# Patient Record
Sex: Female | Born: 1985 | Race: White | Hispanic: No | Marital: Married | Smoking: Never smoker
Health system: Southern US, Community
[De-identification: ages and names within clinical notes are randomized; demographics above are authoritative.]

## PROBLEM LIST (undated history)

## (undated) DIAGNOSIS — T7840XA Allergy, unspecified, initial encounter: Secondary | ICD-10-CM

## (undated) DIAGNOSIS — Z889 Allergy status to unspecified drugs, medicaments and biological substances status: Secondary | ICD-10-CM

## (undated) DIAGNOSIS — Z8719 Personal history of other diseases of the digestive system: Secondary | ICD-10-CM

## (undated) DIAGNOSIS — E079 Disorder of thyroid, unspecified: Secondary | ICD-10-CM

## (undated) DIAGNOSIS — F419 Anxiety disorder, unspecified: Secondary | ICD-10-CM

## (undated) HISTORY — DX: Allergy, unspecified, initial encounter: T78.40XA

## (undated) HISTORY — DX: Disorder of thyroid, unspecified: E07.9

## (undated) HISTORY — DX: Personal history of other diseases of the digestive system: Z87.19

## (undated) HISTORY — DX: Anxiety disorder, unspecified: F41.9

## (undated) HISTORY — DX: Allergy status to unspecified drugs, medicaments and biological substances: Z88.9

---
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
## 2008-08-25 ENCOUNTER — Emergency Department: Payer: Self-pay | Admitting: Emergency Medicine

## 2010-09-14 ENCOUNTER — Emergency Department (HOSPITAL_COMMUNITY): Admission: EM | Admit: 2010-09-14 | Discharge: 2010-09-14 | Payer: Self-pay | Admitting: Family Medicine

## 2011-02-17 LAB — GC/CHLAMYDIA PROBE AMP, GENITAL
Chlamydia, DNA Probe: NEGATIVE
GC Probe Amp, Genital: NEGATIVE

## 2011-02-17 LAB — POCT URINALYSIS DIPSTICK
Bilirubin Urine: NEGATIVE
Ketones, ur: NEGATIVE mg/dL
pH: 8 (ref 5.0–8.0)

## 2011-02-17 LAB — WET PREP, GENITAL: Yeast Wet Prep HPF POC: NONE SEEN

## 2011-02-17 LAB — POCT PREGNANCY, URINE: Preg Test, Ur: NEGATIVE

## 2013-08-21 ENCOUNTER — Telehealth: Payer: Self-pay | Admitting: Family Medicine

## 2013-08-21 NOTE — Telephone Encounter (Signed)
If you can - put her in for an acute appt - will still establish formally in oct

## 2013-08-21 NOTE — Telephone Encounter (Signed)
Pt is trying to est here and would like for you to be her PCP.  Your first new patient apptmt is 09/17/2013, however, the pt says she's experiencing some uncomfortable vaginal burning and would like to be seen sooner if possible. Can you accommodate her a new pt apptmt prior to 10/14? Thank you.

## 2013-08-22 NOTE — Telephone Encounter (Signed)
Left mssg for pt to return call.

## 2013-08-23 ENCOUNTER — Ambulatory Visit (INDEPENDENT_AMBULATORY_CARE_PROVIDER_SITE_OTHER): Payer: 59 | Admitting: Family Medicine

## 2013-08-23 ENCOUNTER — Encounter: Payer: Self-pay | Admitting: Family Medicine

## 2013-08-23 VITALS — BP 136/84 | HR 101 | Temp 99.0°F | Ht 68.5 in | Wt 194.0 lb

## 2013-08-23 DIAGNOSIS — N949 Unspecified condition associated with female genital organs and menstrual cycle: Secondary | ICD-10-CM

## 2013-08-23 DIAGNOSIS — R102 Pelvic and perineal pain: Secondary | ICD-10-CM

## 2013-08-23 DIAGNOSIS — N760 Acute vaginitis: Secondary | ICD-10-CM | POA: Insufficient documentation

## 2013-08-23 LAB — POCT WET PREP (WET MOUNT)

## 2013-08-23 LAB — POCT URINALYSIS DIPSTICK
Bilirubin, UA: NEGATIVE
Blood, UA: NEGATIVE
Ketones, UA: NEGATIVE
pH, UA: 7.5

## 2013-08-23 MED ORDER — METRONIDAZOLE 0.75 % VA GEL: 1.0000 | Freq: Every day | VAGINAL | Status: DC

## 2013-08-23 NOTE — Progress Notes (Signed)
  Subjective:    Patient ID: Tracey Garcia, female    DOB: 1986-01-26, 26 y.o.   MRN: [id number]  HPI Here for acute issue- vaginal discomfort She does not have an appt to establish yet-will do that in Oct   3 weeks of vaginal burning (not itching)  A little bit of discharge -on and off - no odor , little yellow tint to it  No cramping that she knows of   Does not get frequent vaginitis   No known exp to STDs- is monogamous Not on birth control  LMP was early sept  Using condoms   Clear ua today   Review of Systems Review of Systems  Constitutional: Negative for fever, appetite change, fatigue and unexpected weight change.  Eyes: Negative for pain and visual disturbance.  Respiratory: Negative for cough and shortness of breath.   Cardiovascular: Negative for cp or palpitations    Gastrointestinal: Negative for nausea, diarrhea and constipation.  Genitourinary: Negative for urgency and frequency.neg for dysuria   Skin: Negative for pallor or rash   Neurological: Negative for weakness, light-headedness, numbness and headaches.  Hematological: Negative for adenopathy. Does not bruise/bleed easily.  Psychiatric/Behavioral: Negative for dysphoric mood. The patient is not nervous/anxious.         Objective:   Physical Exam  Constitutional: She appears well-developed and well-nourished. No distress.  HENT:  Head: Normocephalic and atraumatic.  Eyes: Conjunctivae and EOM are normal. Pupils are equal, round, and reactive to light.  Cardiovascular: Normal rate and regular rhythm.   Abdominal: Soft. Bowel sounds are normal. She exhibits no distension. There is no tenderness.  Genitourinary:  Mild hyperemia of labia minora without lesions No discharge  Wet prep pos for clue cells  Neurological: She is alert. She has normal reflexes.  Skin: Skin is warm and dry. No rash noted.  Psychiatric: She has a normal mood and affect.          Assessment & Plan:

## 2013-08-23 NOTE — Patient Instructions (Addendum)
Use the metrogel vaginal - as directed Update if not starting to improve in a week or if worsening

## 2013-08-25 NOTE — Assessment & Plan Note (Signed)
Appears to be BV - clue cells seen on her wet prep  tx with metrogel vaginal and update  Disc symptomatic care - see instructions on AVS Will est for care in oct

## 2013-08-26 NOTE — Telephone Encounter (Signed)
Acute apptmt sch for 08/23/2013 and new pt apptmt sch 09/17/2013

## 2013-09-17 ENCOUNTER — Ambulatory Visit: Payer: Self-pay | Admitting: Family Medicine

## 2013-11-04 ENCOUNTER — Encounter: Payer: Self-pay | Admitting: Family Medicine

## 2013-11-04 ENCOUNTER — Ambulatory Visit (INDEPENDENT_AMBULATORY_CARE_PROVIDER_SITE_OTHER): Payer: 59 | Admitting: Family Medicine

## 2013-11-04 VITALS — BP 134/78 | HR 81 | Temp 98.2°F | Ht 68.0 in | Wt 196.0 lb

## 2013-11-04 DIAGNOSIS — Z309 Encounter for contraceptive management, unspecified: Secondary | ICD-10-CM

## 2013-11-04 DIAGNOSIS — K589 Irritable bowel syndrome without diarrhea: Secondary | ICD-10-CM

## 2013-11-04 DIAGNOSIS — F411 Generalized anxiety disorder: Secondary | ICD-10-CM

## 2013-11-04 NOTE — Patient Instructions (Signed)
For IBS I recommend getting citrucel - powder - to mix with water one serving per day  It may help your symptoms  We will do a gyn referral at check out  I recommend for the future - a flu vaccine and also you are due for a tetanus vaccine (Tdap) as well  Take care of yourself  Please send for last note/ pap report and labs from [facility] in [place]

## 2013-11-04 NOTE — Assessment & Plan Note (Signed)
Pt has been generally intolerant of oral contraceptives in the past  Interested in disc other options -like the IUD  Will ref to gyn for this and also annual exam

## 2013-11-04 NOTE — Assessment & Plan Note (Signed)
Disc this in detail  Pt has had it lifelong and developed coping skills Declines counseling (has done marriage counseling in the past) , and declines medication at this time - but will update [place] if she changes her mind Anxiety about shots did cause her to decline flu and Tdap vaccines - disc risks of them today as well

## 2013-11-04 NOTE — Assessment & Plan Note (Signed)
Suggested trial of citrucel to try and normalize bowel habits

## 2013-11-04 NOTE — Progress Notes (Signed)
Pre-visit discussion using our clinic review tool. No additional management support is needed unless otherwise documented below in the visit note.  

## 2013-11-04 NOTE — Progress Notes (Signed)
Subjective:    Patient ID: Tracey Garcia, female    DOB: Jan 04, 1986, 27 y.o.   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 Unsure if she wants to update this  She does not get flu shots - afraid of them - will think about it   Pap Jan 2014 in [place]  She is not on birth control  Not trying to get pregnant  She took OC in the past and she had a lot of emotional side effects -- she tried more than one  Most recently loestrin, , and 2 others  She is afraid of yasmin/ yaz   She has not had labs in a while   Patient Active Problem List   Diagnosis Date Noted  . Vaginitis 08/23/2013   Past Medical History  Diagnosis Date  . Anxiety   . Anxiety   . History of seasonal allergies   . History of IBS    Past Surgical History  Procedure Laterality Date  . Umbilical hernia repair Bilateral 01/2005   History  Substance Use Topics  . Smoking status: Never Smoker   . Smokeless tobacco: Never Used  . Alcohol Use: Yes     Comment: socially   Family History  Problem Relation Age of Onset  . Cancer Mother   . Celiac disease Mother   . Heart attack Paternal Grandfather   . Arthritis Maternal Grandmother   . Colon cancer      great grandmother  . Lung cancer Mother   . High Cholesterol Father   . Heart disease Maternal Grandfather   . High blood pressure Maternal  Grandmother   . High blood pressure Paternal Grandmother   . Diabetes Maternal Grandfather   . Sjogren's syndrome Maternal Grandmother   . Syncope episode Father     micturition syncope  . Other Mother     AV node Tachycardia   Allergies  Allergen Reactions  . Chlorine     Trouble breathing    No current outpatient prescriptions on file prior to visit.   No current facility-administered medications on file prior to visit.    Review of Systems Review of Systems  Constitutional: Negative for fever, appetite change, fatigue and unexpected weight change.  Eyes: Negative for pain and visual disturbance.  Respiratory: Negative for cough and shortness of breath.   Cardiovascular: Negative for cp or palpitations    Gastrointestinal: Negative for nausea and abd pain , pos for intermittent  diarrhea and constipation. (without blood in stool) Genitourinary: Negative for urgency and frequency.  Skin: Negative for pallor or rash   Neurological: Negative for weakness, light-headedness, numbness and headaches.  Hematological: Negative for adenopathy. Does not bruise/bleed easily.  Psychiatric/Behavioral: Negative for  dysphoric mood. The patient is generally anxious        Objective:   Physical Exam  Constitutional: She appears well-developed and well-nourished. No distress.  HENT:  Head: Normocephalic and atraumatic.  Mouth/Throat: Oropharynx is clear and moist.  Eyes: Conjunctivae and EOM are normal. Pupils are equal, round, and reactive to light. No scleral icterus.  Neck: Normal range of motion. Neck supple. No JVD present. No thyromegaly present.  Cardiovascular: Normal rate, regular rhythm and normal heart sounds.  Exam reveals no gallop.   Pulmonary/Chest: Effort normal and breath sounds normal. No respiratory distress. She has no wheezes. She has no rales.  Abdominal: Soft. Bowel sounds are normal. She exhibits no distension and no mass. There is no tenderness.  No suprapubic  tenderness or fullness    Musculoskeletal: She exhibits no edema and no tenderness.  Lymphadenopathy:    She has no cervical adenopathy.  Neurological: She is alert. She has normal reflexes. No cranial nerve deficit. She exhibits normal muscle tone. Coordination normal.  Skin: Skin is warm and dry. No rash noted. No erythema. No pallor.  Psychiatric: Her speech is normal and behavior is normal. Thought content normal. Her mood appears anxious. Her affect is not blunt and not labile. Thought content is not paranoid. She does not exhibit a depressed mood. She expresses no homicidal and no suicidal ideation.  Generally anxious - but attentive and pleasant          Assessment & Plan:

## 2014-02-20 ENCOUNTER — Ambulatory Visit (INDEPENDENT_AMBULATORY_CARE_PROVIDER_SITE_OTHER): Payer: 59 | Admitting: Psychology

## 2014-02-20 DIAGNOSIS — F409 Phobic anxiety disorder, unspecified: Secondary | ICD-10-CM

## 2014-02-25 ENCOUNTER — Ambulatory Visit (INDEPENDENT_AMBULATORY_CARE_PROVIDER_SITE_OTHER): Payer: 59 | Admitting: Psychology

## 2014-02-25 DIAGNOSIS — F4323 Adjustment disorder with mixed anxiety and depressed mood: Secondary | ICD-10-CM

## 2014-03-06 ENCOUNTER — Ambulatory Visit (INDEPENDENT_AMBULATORY_CARE_PROVIDER_SITE_OTHER): Payer: 59 | Admitting: Psychology

## 2014-03-06 DIAGNOSIS — F411 Generalized anxiety disorder: Secondary | ICD-10-CM

## 2014-03-18 ENCOUNTER — Ambulatory Visit (INDEPENDENT_AMBULATORY_CARE_PROVIDER_SITE_OTHER): Payer: 59 | Admitting: Psychology

## 2014-03-18 DIAGNOSIS — F411 Generalized anxiety disorder: Secondary | ICD-10-CM

## 2014-04-03 ENCOUNTER — Ambulatory Visit: Payer: 59 | Admitting: Psychology

## 2014-04-15 ENCOUNTER — Ambulatory Visit (INDEPENDENT_AMBULATORY_CARE_PROVIDER_SITE_OTHER): Payer: 59 | Admitting: Psychology

## 2014-04-15 DIAGNOSIS — F411 Generalized anxiety disorder: Secondary | ICD-10-CM

## 2014-05-06 ENCOUNTER — Ambulatory Visit (INDEPENDENT_AMBULATORY_CARE_PROVIDER_SITE_OTHER): Payer: 59 | Admitting: Psychology

## 2014-05-06 DIAGNOSIS — F411 Generalized anxiety disorder: Secondary | ICD-10-CM

## 2014-05-20 ENCOUNTER — Ambulatory Visit: Payer: 59 | Admitting: Psychology

## 2014-10-20 ENCOUNTER — Telehealth: Payer: Self-pay | Admitting: Family Medicine

## 2014-10-20 DIAGNOSIS — Z0000 Encounter for general adult medical examination without abnormal findings: Secondary | ICD-10-CM | POA: Insufficient documentation

## 2014-10-20 NOTE — Telephone Encounter (Signed)
-----   Message from Alvina Chouerri J Walsh sent at 10/20/2014  4:57 PM EST ----- Regarding: Lab orders for Tuesday, 11.17.15 Patient is scheduled for CPX labs, please order future labs, Thanks , Camelia Engerri

## 2014-10-21 ENCOUNTER — Encounter: Payer: Self-pay | Admitting: Family Medicine

## 2014-10-21 ENCOUNTER — Other Ambulatory Visit (INDEPENDENT_AMBULATORY_CARE_PROVIDER_SITE_OTHER): Payer: 59

## 2014-10-21 ENCOUNTER — Ambulatory Visit (INDEPENDENT_AMBULATORY_CARE_PROVIDER_SITE_OTHER): Payer: 59 | Admitting: Family Medicine

## 2014-10-21 VITALS — BP 122/78 | HR 83 | Temp 98.6°F | Ht 68.0 in | Wt 200.5 lb

## 2014-10-21 DIAGNOSIS — R234 Changes in skin texture: Secondary | ICD-10-CM | POA: Insufficient documentation

## 2014-10-21 DIAGNOSIS — Z0000 Encounter for general adult medical examination without abnormal findings: Secondary | ICD-10-CM

## 2014-10-21 LAB — CBC WITH DIFFERENTIAL/PLATELET
BASOS ABS: 0 10*3/uL (ref 0.0–0.1)
Basophils Relative: 0.6 % (ref 0.0–3.0)
EOS PCT: 1.7 % (ref 0.0–5.0)
Eosinophils Absolute: 0.1 10*3/uL (ref 0.0–0.7)
HCT: 45.5 % (ref 36.0–46.0)
HEMOGLOBIN: 15.3 g/dL — AB (ref 12.0–15.0)
LYMPHS PCT: 31.5 % (ref 12.0–46.0)
Lymphs Abs: 1.6 10*3/uL (ref 0.7–4.0)
MCHC: 33.7 g/dL (ref 30.0–36.0)
MCV: 92.3 fl (ref 78.0–100.0)
MONOS PCT: 8 % (ref 3.0–12.0)
Monocytes Absolute: 0.4 10*3/uL (ref 0.1–1.0)
NEUTROS ABS: 3 10*3/uL (ref 1.4–7.7)
Neutrophils Relative %: 58.2 % (ref 43.0–77.0)
Platelets: 251 10*3/uL (ref 150.0–400.0)
RBC: 4.93 Mil/uL (ref 3.87–5.11)
RDW: 12.8 % (ref 11.5–15.5)
WBC: 5.2 10*3/uL (ref 4.0–10.5)

## 2014-10-21 LAB — LIPID PANEL
Cholesterol: 187 mg/dL (ref 0–200)
HDL: 79.1 mg/dL (ref >39.00)
LDL Cholesterol: 99 mg/dL (ref 0–99)
NONHDL: 107.9
Total CHOL/HDL Ratio: 2
Triglycerides: 43 mg/dL (ref 0.0–149.0)
VLDL: 8.6 mg/dL (ref 0.0–40.0)

## 2014-10-21 LAB — COMPREHENSIVE METABOLIC PANEL
ALT: 14 U/L (ref 0–35)
AST: 19 U/L (ref 0–37)
Albumin: 4.4 g/dL (ref 3.5–5.2)
Alkaline Phosphatase: 61 U/L (ref 39–117)
BUN: 16 mg/dL (ref 6–23)
CALCIUM: 9.1 mg/dL (ref 8.4–10.5)
CHLORIDE: 107 meq/L (ref 96–112)
CO2: 21 meq/L (ref 19–32)
CREATININE: 0.9 mg/dL (ref 0.4–1.2)
GFR: 78.52 mL/min (ref >60.00)
GLUCOSE: 73 mg/dL (ref 70–99)
Potassium: 4.6 mEq/L (ref 3.5–5.1)
SODIUM: 139 meq/L (ref 135–145)
TOTAL PROTEIN: 7.4 g/dL (ref 6.0–8.3)
Total Bilirubin: 0.8 mg/dL (ref 0.2–1.2)

## 2014-10-21 LAB — TSH: TSH: 3 u[IU]/mL (ref 0.35–4.50)

## 2014-10-21 NOTE — Progress Notes (Signed)
Pre visit review using our clinic review tool, if applicable. No additional management support is needed unless otherwise documented below in the visit note. 

## 2014-10-21 NOTE — Assessment & Plan Note (Signed)
Erythematous spot on L breast near nipple with no scale or skin breakdown  S/p bx yesterday with derm-pend results  I agree that mastitis is in the differential - and have a low threshold to px abx if worse  If bx neg and no imp -would consider mamm/us of breast Pt has planned f/u mon-will re check then  She has f/u with derm in 2 wk

## 2014-10-21 NOTE — Progress Notes (Signed)
Subjective:    Patient ID: Tracey Garcia, female    DOB: [birth year], 28 y.o.   MRN: [id number]  HPI Here with a left breast complaint  A week ago she had a red spot on breast - a little less than a week  Happened to be in derm office the next week- and told not to worry about it  A week later another spot came up -so she went to dermatologist yesterday - did have a bx   No breast lumps  Had scant pain - lower L breast occasionally   No breast cancer in the family   Derm thought it was "mastitis"- offered abx/ pt wanted to wait for bx result first   Patient Active Problem List   Diagnosis Date Noted  . Routine general medical examination at a health care facility 10/20/2014  . IBS (irritable bowel syndrome) 11/04/2013  . Generalized anxiety disorder 11/04/2013  . Contraception management 11/04/2013   Past Medical History  Diagnosis Date  . Anxiety   . Anxiety   . History of seasonal allergies   . History of IBS    Past Surgical History  Procedure Laterality Date  . Umbilical hernia repair Bilateral 01/2005   History  Substance Use Topics  . Smoking status: Never Smoker   . Smokeless tobacco: Never Used  . Alcohol Use: 0.0 oz/week    0 Not specified per week     Comment: socially   Family History  Problem Relation Age of Onset  . Cancer Mother   . Celiac disease Mother   . Heart attack Paternal Grandfather   . Arthritis Maternal Grandmother   . Colon cancer      great grandmother  . Lung cancer Mother   . High Cholesterol Father   . Heart disease Maternal Grandfather   . High blood pressure Maternal Grandmother   . High blood pressure Paternal Grandmother   . Diabetes Maternal Grandfather   . Sjogren's syndrome Maternal Grandmother   . Syncope episode Father     micturition syncope  . Other Mother     AV node Tachycardia   Allergies  Allergen Reactions  . Chlorine     Trouble breathing    No current outpatient prescriptions on file prior to visit.    No current facility-administered medications on file prior to visit.      Review of Systems Review of Systems  Constitutional: Negative for fever, appetite change, fatigue and unexpected weight change.  Eyes: Negative for pain and visual disturbance.  Respiratory: Negative for cough and shortness of breath.   Cardiovascular: Negative for cp or palpitations    Gastrointestinal: Negative for nausea, diarrhea and constipation.  Genitourinary: Negative for urgency and frequency. neg for breast nipple d/c or swelling  Skin: Negative for pallor or rash   Neurological: Negative for weakness, light-headedness, numbness and headaches.  Hematological: Negative for adenopathy. Does not bruise/bleed easily.  Psychiatric/Behavioral: Negative for dysphoric mood. The patient is  nervous/anxious.         Objective:   Physical Exam  Constitutional: She appears well-developed and well-nourished. No distress.  overwt and well app  HENT:  Head: Normocephalic and atraumatic.  Mouth/Throat: Oropharynx is clear and moist.  Eyes: Conjunctivae and EOM are normal. Pupils are equal, round, and reactive to light. No scleral icterus.  Neck: Normal range of motion. Neck supple.  Cardiovascular: Normal rate and regular rhythm.   Pulmonary/Chest: Effort normal and breath sounds normal.  Genitourinary:  Breast exam: No  mass, nodules, thickening, tenderness, bulging, retraction, inflamation, nipple discharge noted.  No axillary or clavicular LA.    L breast has a quarter size area of erythema in outer/lower quadrant that is slt tender with recent bx incision and stitch present No streaking    Lymphadenopathy:    She has no cervical adenopathy.  Neurological: She is alert.  Skin: Skin is warm and dry. No rash noted. No pallor.  Psychiatric: She has a normal mood and affect.          Assessment & Plan:   Problem List Items Addressed This Visit      Other   Breast skin changes - Primary     Erythematous spot on L breast near nipple with no scale or skin breakdown  S/p bx yesterday with derm-pend results  I agree that mastitis is in the differential - and have a low threshold to px abx if worse  If bx neg and no imp -would consider mamm/us of breast Pt has planned f/u mon-will re check then  She has f/u with derm in 2 wk

## 2014-10-21 NOTE — Patient Instructions (Signed)
Keep area clean and dry  If worse /increased redness or streaking or fever - please let me and dermatology know  Pending biopsy results  If normal and symptoms persist - can consider breast imaging  Will see you Monday

## 2014-10-27 ENCOUNTER — Ambulatory Visit (INDEPENDENT_AMBULATORY_CARE_PROVIDER_SITE_OTHER): Payer: 59 | Admitting: Family Medicine

## 2014-10-27 ENCOUNTER — Encounter: Payer: Self-pay | Admitting: Family Medicine

## 2014-10-27 VITALS — BP 138/82 | HR 109 | Temp 99.0°F | Ht 68.5 in | Wt 202.5 lb

## 2014-10-27 DIAGNOSIS — B9789 Other viral agents as the cause of diseases classified elsewhere: Secondary | ICD-10-CM

## 2014-10-27 DIAGNOSIS — J069 Acute upper respiratory infection, unspecified: Secondary | ICD-10-CM | POA: Insufficient documentation

## 2014-10-27 DIAGNOSIS — Z0000 Encounter for general adult medical examination without abnormal findings: Secondary | ICD-10-CM

## 2014-10-27 MED ORDER — GUAIFENESIN-CODEINE 100-10 MG/5ML PO SYRP: 5.0000 mL | ORAL_SOLUTION | Freq: Four times a day (QID) | ORAL | Status: DC | PRN

## 2014-10-27 MED ORDER — AZITHROMYCIN 250 MG PO TABS: ORAL_TABLET | ORAL | Status: DC

## 2014-10-27 NOTE — Progress Notes (Signed)
Subjective:    Patient ID: Tracey Garcia, female    DOB: [birth year], 28 y.o.   MRN: [id number]  HPI Here for annual PE and also sick visit   Started uri symptoms last Wednesday  Started with cough and then nasal stuffiness/ congestion - can't hear well (no ear pain or drainage) Pressure in chest  Cannot sleep due to cough  Has had some jaw pain/ not facial pain  Mucous is a little yellow  Had an upset stomach  Temp 99 today - ? How high at home  Has been really achey  Otc: robitussin -not doing much    Saw her dermatologist  Told her her spot on breast was a hive  (by  Biopsy)  - ? What caused it  So she started antihistamine and redness is gone    (have not helped her upper respiratory symptoms)   Wt is up 2 lb with bmi of 30  She declines flu shots  She will make a nurse visit for Tdap when she is feeling better   Last pap 1/14  Went to gyn last year and she had IUD - still gets a period - but it is lighter but longer  Has appt there in feb No cramps (had them in the beginning) and did get an infection originally   Results for orders placed or performed in visit on 10/21/14  CBC with Differential  Result Value Ref Range   WBC 5.2 4.0 - 10.5 K/uL   RBC 4.93 3.87 - 5.11 Mil/uL   Hemoglobin 15.3 (H) 12.0 - 15.0 g/dL   HCT 56.445.5 33.236.0 - 95.146.0 %   MCV 92.3 78.0 - 100.0 fl   MCHC 33.7 30.0 - 36.0 g/dL   RDW 88.412.8 16.611.5 - 06.315.5 %   Platelets 251.0 150.0 - 400.0 K/uL   Neutrophils Relative % 58.2 43.0 - 77.0 %   Lymphocytes Relative 31.5 12.0 - 46.0 %   Monocytes Relative 8.0 3.0 - 12.0 %   Eosinophils Relative 1.7 0.0 - 5.0 %   Basophils Relative 0.6 0.0 - 3.0 %   Neutro Abs 3.0 1.4 - 7.7 K/uL   Lymphs Abs 1.6 0.7 - 4.0 K/uL   Monocytes Absolute 0.4 0.1 - 1.0 K/uL   Eosinophils Absolute 0.1 0.0 - 0.7 K/uL   Basophils Absolute 0.0 0.0 - 0.1 K/uL  Comprehensive metabolic panel  Result Value Ref Range   Sodium 139 135 - 145 mEq/L   Potassium 4.6 3.5 - 5.1 mEq/L   Chloride 107 96 - 112 mEq/L   CO2 21 19 - 32 mEq/L   Glucose, Bld 73 70 - 99 mg/dL   BUN 16 6 - 23 mg/dL   Creatinine, Ser 0.9 0.4 - 1.2 mg/dL   Total Bilirubin 0.8 0.2 - 1.2 mg/dL   Alkaline Phosphatase 61 39 - 117 U/L   AST 19 0 - 37 U/L   ALT 14 0 - 35 U/L   Total Protein 7.4 6.0 - 8.3 g/dL   Albumin 4.4 3.5 - 5.2 g/dL   Calcium 9.1 8.4 - 01.610.5 mg/dL   GFR 01.0978.52 >32.35>60.00 mL/min  Lipid panel  Result Value Ref Range   Cholesterol 187 0 - 200 mg/dL   Triglycerides 57.343.0 0.0 - 149.0 mg/dL   HDL 22.0279.10 >54.27>39.00 mg/dL   VLDL 8.6 0.0 - 06.240.0 mg/dL   LDL Cholesterol 99 0 - 99 mg/dL   Total CHOL/HDL Ratio 2    NonHDL 107.90   TSH  Result Value  Ref Range   TSH 3.00 0.35 - 4.50 uIU/mL    Cholesterol risk ratio is very low Excellent HDL    Patient Active Problem List   Diagnosis Date Noted  . Viral URI with cough 10/27/2014  . Breast skin changes 10/21/2014  . Routine general medical examination at a health care facility 10/20/2014  . IBS (irritable bowel syndrome) 11/04/2013  . Generalized anxiety disorder 11/04/2013  . Contraception management 11/04/2013   Past Medical History  Diagnosis Date  . Anxiety   . Anxiety   . History of seasonal allergies   . History of IBS    Past Surgical History  Procedure Laterality Date  . Umbilical hernia repair Bilateral 01/2005   History  Substance Use Topics  . Smoking status: Never Smoker   . Smokeless tobacco: Never Used  . Alcohol Use: 0.0 oz/week    0 Not specified per week     Comment: socially   Family History  Problem Relation Age of Onset  . Cancer Mother   . Celiac disease Mother   . Heart attack Paternal Grandfather   . Arthritis Maternal Grandmother   . Colon cancer      great grandmother  . Lung cancer Mother   . High Cholesterol Father   . Heart disease Maternal Grandfather   . High blood pressure Maternal Grandmother   . High blood pressure Paternal Grandmother   . Diabetes Maternal Grandfather   . Sjogren's  syndrome Maternal Grandmother   . Syncope episode Father     micturition syncope  . Other Mother     AV node Tachycardia   Allergies  Allergen Reactions  . Chlorine     Trouble breathing    No current outpatient prescriptions on file prior to visit.   No current facility-administered medications on file prior to visit.    Review of Systems Review of Systems  Constitutional:pos for low grade temp elevation and malaise  ENT pos for cong and rhinorrhea and st Eyes: Negative for pain and visual disturbance.  Respiratory: Negative for wheeze and shortness of breath.   Cardiovascular: Negative for cp or palpitations    Gastrointestinal: Negative for nausea, diarrhea and constipation.  Genitourinary: Negative for urgency and frequency.  Skin: Negative for pallor or rash   Neurological: Negative for weakness, light-headedness, numbness and headaches.  Hematological: Negative for adenopathy. Does not bruise/bleed easily.  Psychiatric/Behavioral: Negative for dysphoric mood. The patient is not nervous/anxious.         Objective:   Physical Exam  Constitutional: She appears well-developed and well-nourished. No distress.  obese and well appearing   HENT:  Head: Normocephalic and atraumatic.  Right Ear: External ear normal.  Left Ear: External ear normal.  Mouth/Throat: Oropharynx is clear and moist.  Nares are injected and congested  No sinus tenderness  Eyes: Conjunctivae and EOM are normal. Pupils are equal, round, and reactive to light. Right eye exhibits no discharge. Left eye exhibits no discharge. No scleral icterus.  Neck: Normal range of motion. Neck supple. No JVD present. No thyromegaly present.  Cardiovascular: Normal rate, regular rhythm, normal heart sounds and intact distal pulses.  Exam reveals no gallop.   Pulmonary/Chest: Effort normal and breath sounds normal. No respiratory distress. She has no wheezes. She has no rales.  Harsh bs  Few scattered rhonchi No  rales   Abdominal: Soft. Bowel sounds are normal. She exhibits no distension and no mass. There is no tenderness.  Musculoskeletal: She exhibits no edema or  tenderness.  Lymphadenopathy:    She has no cervical adenopathy.  Neurological: She is alert. She has normal reflexes. No cranial nerve deficit. She exhibits normal muscle tone. Coordination normal.  Skin: Skin is warm and dry. No rash noted. No erythema. No pallor.  Psychiatric: She has a normal mood and affect.          Assessment & Plan:   Problem List Items Addressed This Visit      Respiratory   Viral URI with cough    Given length of illness and worsening prod cough will cover with zpak Robitussin AC for cough with caution Disc symptomatic care - see instructions on AVS Update if not starting to improve in a week or if worsening      Relevant Medications      azithromycin (ZITHROMAX) tablet     Other   Routine general medical examination at a health care facility - Primary    Reviewed health habits including diet and exercise and skin cancer prevention Reviewed appropriate screening tests for age  Also reviewed health mt list, fam hx and immunization status , as well as social and family history   Labs rev in detail  Disc goals of healthy diet and exercise and wt loss Good cholesterol profile  Will schedule nurse visit for Tdap when able

## 2014-10-27 NOTE — Patient Instructions (Signed)
When you are feeling better make a nurse appointment for Tdap (tetanus shot)  Take zithromax as directed for upper respiratory infection  Try robitussin AC for cough- caution of sedation Lots of fluids and rest  Update if not starting to improve in a week or if worsening     Labs look good  Take care of yourself  Work on healthy diet and exercise

## 2014-10-27 NOTE — Progress Notes (Signed)
Pre visit review using our clinic review tool, if applicable. No additional management support is needed unless otherwise documented below in the visit note. 

## 2014-10-27 NOTE — Assessment & Plan Note (Signed)
Reviewed health habits including diet and exercise and skin cancer prevention Reviewed appropriate screening tests for age  Also reviewed health mt list, fam hx and immunization status , as well as social and family history   Labs rev in detail  Disc goals of healthy diet and exercise and wt loss Good cholesterol profile  Will schedule nurse visit for Tdap when able

## 2014-10-27 NOTE — Assessment & Plan Note (Signed)
Given length of illness and worsening prod cough will cover with zpak Robitussin AC for cough with caution Disc symptomatic care - see instructions on AVS Update if not starting to improve in a week or if worsening

## 2014-12-19 ENCOUNTER — Ambulatory Visit (INDEPENDENT_AMBULATORY_CARE_PROVIDER_SITE_OTHER): Payer: 59 | Admitting: *Deleted

## 2014-12-19 DIAGNOSIS — Z23 Encounter for immunization: Secondary | ICD-10-CM

## 2015-06-25 ENCOUNTER — Telehealth: Payer: Self-pay | Admitting: *Deleted

## 2015-06-25 ENCOUNTER — Encounter: Payer: Self-pay | Admitting: Family Medicine

## 2015-06-25 ENCOUNTER — Ambulatory Visit (INDEPENDENT_AMBULATORY_CARE_PROVIDER_SITE_OTHER): Payer: 59 | Admitting: Family Medicine

## 2015-06-25 VITALS — BP 106/70 | HR 68 | Temp 98.5°F | Ht 68.5 in | Wt 193.8 lb

## 2015-06-25 DIAGNOSIS — J0101 Acute recurrent maxillary sinusitis: Secondary | ICD-10-CM

## 2015-06-25 DIAGNOSIS — J0100 Acute maxillary sinusitis, unspecified: Secondary | ICD-10-CM | POA: Insufficient documentation

## 2015-06-25 MED ORDER — AMOXICILLIN 500 MG PO CAPS: 1000.0000 mg | ORAL_CAPSULE | Freq: Two times a day (BID) | ORAL | Status: DC

## 2015-06-25 NOTE — Assessment & Plan Note (Signed)
Nml eye exam. Most liekly due to sinus infection. Treat with antibiotics. Follow up if not improvig as expected.  Migraine oin differential but pt has no history.

## 2015-06-25 NOTE — Telephone Encounter (Signed)
Pt left voicemail at Triage. Pt wanted to ask Dr. Ermalene Searing if her abx would interfere with her IUD, pt said she forgot to ask Dr. Ermalene Searing when she was in the office

## 2015-06-25 NOTE — Telephone Encounter (Signed)
Ginnie notified as instructed by telephone. 

## 2015-06-25 NOTE — Progress Notes (Signed)
Subjective:    Patient ID: Tracey Garcia, female    DOB: 12-09-1985, 29 y.o.   MRN: [id number]  HPI 29 year old female pt  Of Dr. Royden Purl with history of GAD, IBS presents with new onset headache in right head, behind right eye and below right eye over sinus x 5 days. No tooth pain.  She has had some sinus drainage, yellow greenish.  Some nausea today.  Uing mucinex and ibuprofen.  Improved some but recurrent.  No vision changes.  No fever.  No ear pain.  No SOB.  Social History /Family History/Past Medical History reviewed and updated if needed. Has history of allergies,  No past migraines.    Review of Systems  Constitutional: Positive for fatigue. Negative for fever.  HENT: Positive for sinus pressure. Negative for ear pain, mouth sores, sneezing and sore throat.   Eyes: Negative for pain.  Respiratory: Negative for chest tightness and shortness of breath.   Cardiovascular: Negative for chest pain, palpitations and leg swelling.  Gastrointestinal: Negative for abdominal pain.  Genitourinary: Negative for dysuria.       Objective:   Physical Exam  Constitutional: She is oriented to person, place, and time. Vital signs are normal. She appears well-developed and well-nourished. She is cooperative.  Non-toxic appearance. She does not appear ill. No distress.  HENT:  Head: Normocephalic.  Right Ear: Hearing, tympanic membrane, external ear and ear canal normal. Tympanic membrane is not erythematous, not retracted and not bulging.  Left Ear: Hearing, tympanic membrane, external ear and ear canal normal. Tympanic membrane is not erythematous, not retracted and not bulging.  Nose: Mucosal edema present. No rhinorrhea. Right sinus exhibits maxillary sinus tenderness. Right sinus exhibits no frontal sinus tenderness. Left sinus exhibits no maxillary sinus tenderness and no frontal sinus tenderness.  Mouth/Throat: Uvula is midline, oropharynx is clear and moist and mucous membranes  are normal.  Eyes: Conjunctivae, EOM and lids are normal. Pupils are equal, round, and reactive to light. Lids are everted and swept, no foreign bodies found. Right conjunctiva is not injected. Left conjunctiva is not injected.  Fundoscopic exam:      The right eye shows no papilledema.       The left eye shows no papilledema.  Neck: Trachea normal and normal range of motion. Neck supple. Carotid bruit is not present. No thyroid mass and no thyromegaly present.  Cardiovascular: Normal rate, regular rhythm, S1 normal, S2 normal, normal heart sounds, intact distal pulses and normal pulses.  Exam reveals no gallop and no friction rub.   No murmur heard. Pulmonary/Chest: Effort normal and breath sounds normal. No tachypnea. No respiratory distress. She has no decreased breath sounds. She has no wheezes. She has no rhonchi. She has no rales.  Abdominal: Soft. Normal appearance and bowel sounds are normal. There is no tenderness.  Neurological: She is alert and oriented to person, place, and time. She has normal strength and normal reflexes. No cranial nerve deficit or sensory deficit. She exhibits normal muscle tone. She displays a negative Romberg sign. Coordination and gait normal. GCS eye subscore is 4. GCS verbal subscore is 5. GCS motor subscore is 6.  Nml cerebellar exam   No papilledema  Skin: Skin is warm, dry and intact. No rash noted.  Psychiatric: She has a normal mood and affect. Her speech is normal and behavior is normal. Judgment and thought content normal. Her mood appears not anxious. Cognition and memory are normal. Cognition and memory are not impaired. She  does not exhibit a depressed mood. She exhibits normal recent memory and normal remote memory.          Assessment & Plan:

## 2015-06-25 NOTE — Telephone Encounter (Signed)
Always good jus in case while on antibiotics to use condom as back up birth control given antibiotics can make hormones slightly less active. No other interactions etc.

## 2015-06-25 NOTE — Progress Notes (Signed)
Pre visit review using our clinic review tool, if applicable. No additional management support is needed unless otherwise documented below in the visit note. 

## 2015-06-25 NOTE — Patient Instructions (Addendum)
Complete a course of amoxicillin x 10 days. Mucinex twice daily. Start nasal saline irrigation or spray. Use tylenol for pain given ibuprofen may irritating stomach. Call if fever or if not improving as expected.

## 2015-10-23 ENCOUNTER — Encounter: Payer: Self-pay | Admitting: Family Medicine

## 2015-10-23 ENCOUNTER — Ambulatory Visit (INDEPENDENT_AMBULATORY_CARE_PROVIDER_SITE_OTHER): Payer: 59 | Admitting: Family Medicine

## 2015-10-23 VITALS — BP 128/72 | HR 99 | Temp 97.9°F | Wt 188.0 lb

## 2015-10-23 DIAGNOSIS — J069 Acute upper respiratory infection, unspecified: Secondary | ICD-10-CM | POA: Diagnosis not present

## 2015-10-23 DIAGNOSIS — B9789 Other viral agents as the cause of diseases classified elsewhere: Principal | ICD-10-CM

## 2015-10-23 DIAGNOSIS — J329 Chronic sinusitis, unspecified: Secondary | ICD-10-CM | POA: Diagnosis not present

## 2015-10-23 MED ORDER — MELOXICAM 7.5 MG PO TABS: 7.5000 mg | ORAL_TABLET | Freq: Every day | ORAL | Status: DC

## 2015-10-23 MED ORDER — GUAIFENESIN-CODEINE 100-10 MG/5ML PO SYRP: 5.0000 mL | ORAL_SOLUTION | Freq: Every evening | ORAL | Status: DC | PRN

## 2015-10-23 NOTE — Progress Notes (Signed)
Pre visit review using our clinic review tool, if applicable. No additional management support is needed unless otherwise documented below in the visit note. 

## 2015-10-23 NOTE — Patient Instructions (Addendum)
Drink lots of fluids  Take meloxicam once daily with a meal for chest discomfort (an anti inflammatory pain med)- if it upsets stomach stop it  Breathe steam  flonase nasal spray- 2 sprays in each nostril once daily for about 2 weeks Also get afrin nasal spray (12 hour) - 1-2 sprays in each nostril twice daily as needed for no more than 2-3 days  mucinex (plain)- loosens mucous in head and the chest if you want to try it -for day Try the robitussin ac (codeine) for pm - helps sleep and cough   Update if not starting to improve in a week or if worsening      Stop at check out for referral to ENT

## 2015-10-23 NOTE — Progress Notes (Signed)
Subjective:    Patient ID: Tracey Garcia, female    DOB: 1986-04-12, 29 y.o.   MRN: [id number]  HPI Here with a uri  Started with a ST on Wednesday   Tried mucinex cold and cough -not helpful  Tylenol for pain  (ibuprofen upsets her stomach)   Bad congestion  Chest and abd are sore - hurts to take a deep breath  Throat is irritated  Some cough  Loosing her voice also   Mucous is clear to green nasal  A little sinus pressure  Cough is not productive -- not rattling  No fever   Patient Active Problem List   Diagnosis Date Noted  . Acute maxillary sinusitis 06/25/2015  . Viral URI with cough 10/27/2014  . Breast skin changes 10/21/2014  . Routine general medical examination at a health care facility 10/20/2014  . IBS (irritable bowel syndrome) 11/04/2013  . Generalized anxiety disorder 11/04/2013  . Contraception management 11/04/2013   Past Medical History  Diagnosis Date  . Anxiety   . Anxiety   . History of seasonal allergies   . History of IBS    Past Surgical History  Procedure Laterality Date  . Umbilical hernia repair Bilateral 01/2005   Social History  Substance Use Topics  . Smoking status: Never Smoker   . Smokeless tobacco: Never Used  . Alcohol Use: 0.0 oz/week    0 Standard drinks or equivalent per week     Comment: socially   Family History  Problem Relation Age of Onset  . Cancer Mother   . Celiac disease Mother   . Heart attack Paternal Grandfather   . Arthritis Maternal Grandmother   . Colon cancer      great grandmother  . Lung cancer Mother   . High Cholesterol Father   . Heart disease Maternal Grandfather   . High blood pressure Maternal Grandmother   . High blood pressure Paternal Grandmother   . Diabetes Maternal Grandfather   . Sjogren's syndrome Maternal Grandmother   . Syncope episode Father     micturition syncope  . Other Mother     AV node Tachycardia   Allergies  Allergen Reactions  . Chlorine     Trouble  breathing    No current outpatient prescriptions on file prior to visit.   No current facility-administered medications on file prior to visit.     Review of Systems Review of Systems  Constitutional: Negative for fever, appetite change,  and unexpected weight change.  ENT pos for cong /rhinorrhea / neg for sinus pain  Eyes: Negative for pain and visual disturbance.  Respiratory: Negative for wheeze and shortness of breath.  pos for chest wall pain  Cardiovascular: Negative for  palpitations    Gastrointestinal: Negative for nausea, diarrhea and constipation.  Genitourinary: Negative for urgency and frequency.  Skin: Negative for pallor or rash   Neurological: Negative for weakness, light-headedness, numbness and headaches.  Hematological: Negative for adenopathy. Does not bruise/bleed easily.  Psychiatric/Behavioral: Negative for dysphoric mood. The patient is not nervous/anxious.         Objective:   Physical Exam  Constitutional: She appears well-developed and well-nourished. No distress.  Well appearing   HENT:  Head: Normocephalic and atraumatic.  Right Ear: External ear normal.  Left Ear: External ear normal.  Mouth/Throat: Oropharynx is clear and moist.  Nares are injected and congested  No sinus tenderness Clear rhinorrhea and post nasal drip   Eyes: Conjunctivae and EOM are normal. Pupils  are equal, round, and reactive to light. Right eye exhibits no discharge. Left eye exhibits no discharge.  Neck: Normal range of motion. Neck supple.  Cardiovascular: Normal rate and normal heart sounds.   Pulmonary/Chest: Effort normal and breath sounds normal. No respiratory distress. She has no wheezes. She has no rales. She exhibits no tenderness.  Lymphadenopathy:    She has no cervical adenopathy.  Neurological: She is alert.  Skin: Skin is warm and dry. No rash noted.  Psychiatric: She has a normal mood and affect.          Assessment & Plan:   Problem List Items  Addressed This Visit      Respiratory   Recurrent sinusitis    Pt has viral uri now and pt states that she gets a sinus infection EVERY time she gets a cold Disc ways to prevent bacterial sinusitis Also ref to ENT for help with this -may need eval for sinus or nasal polyps or obstruction       Relevant Medications   guaiFENesin-codeine (ROBITUSSIN AC) 100-10 MG/5ML syrup   Other Relevant Orders   Ambulatory referral to ENT   Viral URI with cough - Primary    Reassuring exam  Suspect CWP from strain due to cough or costochondritis No signs of bacterial infx Disc symptomatic care - see instructions on AVS - (also for prev of sinusitis that she is prone to)  Robitussin AC for cough with caution  Update if not starting to improve in a week or if worsening

## 2015-10-25 NOTE — Assessment & Plan Note (Signed)
Reassuring exam  Suspect CWP from strain due to cough or costochondritis No signs of bacterial infx Disc symptomatic care - see instructions on AVS - (also for prev of sinusitis that she is prone to)  Robitussin AC for cough with caution  Update if not starting to improve in a week or if worsening

## 2015-10-25 NOTE — Assessment & Plan Note (Signed)
Pt has viral uri now and pt states that she gets a sinus infection EVERY time she gets a cold Disc ways to prevent bacterial sinusitis Also ref to ENT for help with this -may need eval for sinus or nasal polyps or obstruction

## 2015-11-19 ENCOUNTER — Other Ambulatory Visit: Payer: Self-pay | Admitting: Family Medicine

## 2016-02-04 LAB — HM PAP SMEAR: HM PAP: NEGATIVE

## 2016-08-12 ENCOUNTER — Ambulatory Visit (INDEPENDENT_AMBULATORY_CARE_PROVIDER_SITE_OTHER): Payer: BC Managed Care – PPO | Admitting: Family Medicine

## 2016-08-12 ENCOUNTER — Encounter: Payer: Self-pay | Admitting: Family Medicine

## 2016-08-12 VITALS — BP 112/68 | HR 69 | Temp 98.9°F | Ht 68.5 in | Wt 196.8 lb

## 2016-08-12 DIAGNOSIS — Z111 Encounter for screening for respiratory tuberculosis: Secondary | ICD-10-CM | POA: Diagnosis not present

## 2016-08-12 DIAGNOSIS — Z021 Encounter for pre-employment examination: Secondary | ICD-10-CM

## 2016-08-12 NOTE — Patient Instructions (Addendum)
Consider a flu shot this fall  PPD (TB skin test) today  Come back to have it read on Monday before 4:30  Take care of yourself

## 2016-08-12 NOTE — Progress Notes (Signed)
Pre visit review using our clinic review tool, if applicable. No additional management support is needed unless otherwise documented below in the visit note. 

## 2016-08-12 NOTE — Progress Notes (Signed)
Subjective:    Patient ID: Tracey Garcia, female    DOB: [birth year], 30 y.o.   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 Screening-pulmonary TB 08/12/2016  . Pre-employment examination 08/12/2016  . Recurrent sinusitis 10/23/2015  . Breast skin changes 10/21/2014  . Routine general medical examination at a health care facility 10/20/2014  . IBS (irritable bowel syndrome) 11/04/2013  . Generalized anxiety disorder 11/04/2013  . Contraception management 11/04/2013   Past Medical History:  Diagnosis Date  . Anxiety   . Anxiety   . History of IBS   . History of seasonal allergies    Past Surgical History:  Procedure Laterality Date  . UMBILICAL HERNIA REPAIR Bilateral 01/2005   Social History  Substance Use Topics  . Smoking status: Never Smoker  . Smokeless tobacco: Never Used  . Alcohol use 0.0 oz/week     Comment: socially   Family History  Problem Relation Age of Onset  . Cancer Mother   . Celiac disease Mother   . Heart attack Paternal Grandfather   . Arthritis Maternal Grandmother   . Colon cancer      great grandmother  . Lung cancer Mother   . High Cholesterol Father   . Heart disease Maternal Grandfather   . High blood pressure Maternal Grandmother   . High blood pressure Paternal Grandmother   . Diabetes Maternal Grandfather   . Sjogren's syndrome Maternal Grandmother   . Syncope episode Father    micturition syncope  . Other Mother     AV node Tachycardia   Allergies  Allergen Reactions  . Chlorine     Trouble breathing    No current outpatient prescriptions on file prior to visit.   No current facility-administered medications on file prior to visit.      Review of Systems Review of Systems  Constitutional: Negative for fever, appetite change, fatigue and unexpected weight change.  Eyes: Negative for pain and visual disturbance.  Respiratory: Negative for cough and shortness of breath.   Cardiovascular: Negative for cp or palpitations    Gastrointestinal: Negative for nausea, diarrhea and constipation.  Genitourinary: Negative for urgency and frequency.  Skin: Negative for pallor or rash   Neurological: Negative for weakness, light-headedness, numbness and headaches.  Hematological: Negative for adenopathy. Does not bruise/bleed easily.  Psychiatric/Behavioral: Negative for dysphoric mood. The patient is not nervous/anxious.         Objective:   Physical Exam  Constitutional: She appears well-developed and well-nourished. No distress.  overwt and well appearing   HENT:  Head: Normocephalic and atraumatic.  Mouth/Throat: Oropharynx is clear and moist.  Eyes: Conjunctivae and EOM are normal. Pupils are equal, round, and reactive to light.  Neck: Normal range of motion. Neck supple. No JVD present. Carotid bruit is not present.  No thyromegaly present.  Cardiovascular: Normal rate, regular rhythm, normal heart sounds and intact distal pulses.  Exam reveals no gallop.   Pulmonary/Chest: Effort normal and breath sounds normal. No respiratory distress. She has no wheezes. She has no rales.  No crackles  Abdominal: Soft. Bowel sounds are normal. She exhibits no distension, no abdominal bruit and no mass. There is no tenderness.  Musculoskeletal: She exhibits no edema.  Lymphadenopathy:    She has no cervical adenopathy.  Neurological: She is alert. She has normal  reflexes.  Skin: Skin is warm and dry. No rash noted.  Psychiatric: She has a normal mood and affect.          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

## 2016-08-14 NOTE — Assessment & Plan Note (Signed)
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

## 2016-08-14 NOTE — Assessment & Plan Note (Signed)
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

## 2016-08-15 LAB — TB SKIN TEST: TB SKIN TEST: NEGATIVE

## 2016-08-17 ENCOUNTER — Encounter: Payer: Self-pay | Admitting: Family Medicine

## 2016-08-17 ENCOUNTER — Ambulatory Visit (INDEPENDENT_AMBULATORY_CARE_PROVIDER_SITE_OTHER): Payer: BC Managed Care – PPO | Admitting: Family Medicine

## 2016-08-17 VITALS — BP 122/88 | HR 65 | Temp 98.5°F | Wt 197.8 lb

## 2016-08-17 DIAGNOSIS — J309 Allergic rhinitis, unspecified: Secondary | ICD-10-CM

## 2016-08-17 DIAGNOSIS — H9203 Otalgia, bilateral: Secondary | ICD-10-CM

## 2016-08-17 DIAGNOSIS — F458 Other somatoform disorders: Secondary | ICD-10-CM

## 2016-08-17 NOTE — Progress Notes (Signed)
Subjective:    Patient ID: Tracey Garcia, female    DOB: 1986/09/08, 30 y.o.   MRN: [id number]  HPI This is a 30 yo female who presents with bilateral ear pain x 3 days, right ear worse than left. Ear, jaw and neck pain. Pain is sharp and constant. She has taken ibuprofen 200 mg once with some relief. 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 Occasional nonproductive cough. Known allergies, takes Zyrtec daily with "so-so" control. Has flonase at home, uses PRN. Slept well last night. No cough, no chest pain, no SOB, no fever/chills. 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 Night guard package said not to use if TMJ, so she didn't use. Known clenching, doesn't think she grinds.   Past Medical History:  Diagnosis Date  . Anxiety   . Anxiety   . History of IBS   . History of seasonal allergies    Past Surgical History:  Procedure Laterality Date  . UMBILICAL HERNIA REPAIR Bilateral 01/2005   Family History  Problem Relation Age of Onset  . Cancer Mother   . Celiac disease Mother   . Heart attack Paternal Grandfather   . Arthritis Maternal Grandmother   . Colon cancer      great grandmother  . Lung cancer Mother   . High Cholesterol Father   . Heart disease Maternal Grandfather   . High blood pressure Maternal Grandmother   . High blood pressure Paternal Grandmother   . Diabetes Maternal Grandfather   . Sjogren's syndrome Maternal Grandmother   . Syncope episode Father     micturition syncope  . Other Mother     AV node Tachycardia   Social History  Substance Use Topics  . Smoking status: Never Smoker  . Smokeless tobacco: Never Used  . Alcohol use 0.0 oz/week     Comment: socially      Review of Systems Per HPI    Objective:   Physical Exam Physical Exam  Constitutional: Oriented to person, place, and time. He appears well-developed and well-nourished.  HENT:  Head: Normocephalic and atraumatic.  Eyes:  Conjunctivae are normal.  Ears: Normal canals, TMs slightly opaque, no bulging, no retractions, no erythema, non tender to exam.  Some clicking bilaterally with opening/closing of jaw.  Neck: Normal range of motion. Neck supple.  Cardiovascular: Normal rate, regular rhythm and normal heart sounds.   Pulmonary/Chest: Effort normal and breath sounds normal.  Musculoskeletal: Normal range of motion.  Neurological: Alert and oriented to person, place, and time.  Skin: Skin is warm and dry.  Psychiatric: Normal mood and affect. Behavior is normal. Judgment and thought content normal.  Vitals reviewed.  BP 122/88   Pulse 65   Temp 98.5 F (36.9 C)   Wt 197 lb 12.8 oz (89.7 kg)   LMP 08/08/2016   SpO2 98%   BMI 29.64 kg/m  Wt Readings from Last 3 Encounters:  08/17/16 197 lb 12.8 oz (89.7 kg)  08/12/16 196 lb 12 oz (89.2 kg)  10/23/15 188 lb (85.3 kg)       Assessment & Plan:  1. Otalgia of both ears - unsure of exact cause- could be related to TMJ/bruxism/eustachian tube disorder/allergies - reassured of normal exam - ibuprofen 200 mg- 2-3 tablets every 8-12 hours prn - resume flonase, continue zyrtec, can add decongestant - RTC if no improvement in 3-5 days, sooner if worsening symptoms/fever/drainage  2. Allergic rhinitis, unspecified allergic  rhinitis type -resume flonase, continue zyrtec, can add decongestant  3. Bruxism - suggested she try night guard and follow up with her dentist if worsening   Olean Ree, FNP-BC  [facility], [facility]  08/18/2016 9:58 AM

## 2016-08-17 NOTE — Progress Notes (Signed)
Pre visit review using our clinic review tool, if applicable. No additional management support is needed unless otherwise documented below in the visit note. 

## 2016-08-17 NOTE — Patient Instructions (Signed)
Please start your flonase- twice a day for 1 week then once a day Ibuprofen 2-3 tablets every 8 to 12 hours for pain Continue Zyrtec Can also use Sudafed (generic is fine)- one in the morningg and one after lunch  Earache An earache, also called otalgia, can be caused by many things. Pain from an earache can be sharp, dull, or burning. The pain may be temporary or constant. Earaches can be caused by problems with the ear, such as infection in either the middle ear or the ear canal, injury, impacted ear wax, middle ear pressure, or a foreign body in the ear. Ear pain can also result from problems in other areas. This is called referred pain. For example, pain can come from a sore throat, a tooth infection, or problems with the jaw or the joint between the jaw and the skull (temporomandibular joint, or TMJ). The cause of an earache is not always easy to identify. Watchful waiting may be appropriate for some earaches until a clear cause of the pain can be found. HOME CARE INSTRUCTIONS Watch your condition for any changes. The following actions may help to lessen any discomfort that you are feeling:  Take medicines only as directed by your health care provider. This includes ear drops.  Apply ice to your outer ear to help reduce pain.  Put ice in a plastic bag.  Place a towel between your skin and the bag.  Leave the ice on for 20 minutes, 2-3 times per day.  Do not put anything in your ear other than medicine that is prescribed by your health care provider.  Try resting in an upright position instead of lying down. This may help to reduce pressure in the middle ear and relieve pain.  Chew gum if it helps to relieve your ear pain.  Control any allergies that you have.  Keep all follow-up visits as directed by your health care provider. This is important. SEEK MEDICAL CARE IF:  Your pain does not improve within 2 days.  You have a fever.  You have new or worsening symptoms. SEEK  IMMEDIATE MEDICAL CARE IF:  You have a severe headache.  You have a stiff neck.  You have difficulty swallowing.  You have redness or swelling behind your ear.  You have drainage from your ear.  You have hearing loss.  You feel dizzy.   This information is not intended to replace advice given to you by your health care provider. Make sure you discuss any questions you have with your health care provider.   Document Released: 07/08/2004 Document Revised: 12/12/2014 Document Reviewed: 06/22/2014 Elsevier Interactive Patient Education [employer]2016 [employer]

## 2016-10-18 ENCOUNTER — Encounter: Payer: Self-pay | Admitting: Primary Care

## 2016-10-18 ENCOUNTER — Ambulatory Visit (INDEPENDENT_AMBULATORY_CARE_PROVIDER_SITE_OTHER): Payer: BC Managed Care – PPO | Admitting: Primary Care

## 2016-10-18 VITALS — BP 136/86 | HR 97 | Temp 98.0°F | Ht 68.5 in | Wt 200.4 lb

## 2016-10-18 DIAGNOSIS — J069 Acute upper respiratory infection, unspecified: Secondary | ICD-10-CM

## 2016-10-18 MED ORDER — BENZONATATE 200 MG PO CAPS: 200.0000 mg | ORAL_CAPSULE | Freq: Three times a day (TID) | ORAL | 0 refills | Status: DC | PRN

## 2016-10-18 MED ORDER — AMOXICILLIN 500 MG PO TABS: 500.0000 mg | ORAL_TABLET | Freq: Two times a day (BID) | ORAL | 0 refills | Status: DC

## 2016-10-18 NOTE — Addendum Note (Signed)
Addended by: Tawnya CrookSAMBATH, Kehaulani Fruin on: 10/18/2016 11:54 AM   Modules accepted: Orders

## 2016-10-18 NOTE — Progress Notes (Signed)
Subjective:    Patient ID: Bartholome BillJennifer Elting, female    DOB: [birth year], 30 y.o.   MRN: [id number]  HPI  Ms. Barry DienesOwens is a 30 year old female who presents today with a chief complaint of cough. She also reports sore throat, chest congestion, nasal congestion, body aches. Her symptoms began 8 weeks ago. Overall she's feeling worse than she did last week. She's taken sudafed, Mucinex, and Robitussin without much improvement. Her most bothersome symptom is cough which is present during the day and evening. She denies fevers. She does work in an [employer] and has been exposed to numerous sick children.   Review of Systems  Constitutional: Positive for chills and fatigue. Negative for fever.  HENT: Positive for congestion and sore throat. Negative for ear pain, postnasal drip and sinus pressure.   Respiratory: Positive for cough. Negative for shortness of breath and wheezing.   Cardiovascular: Negative for chest pain.  Gastrointestinal: Negative for nausea.       Past Medical History:  Diagnosis Date  . Anxiety   . Anxiety   . History of IBS   . History of seasonal allergies      Social History   Social History  . Marital status: Married    Spouse name: N/A  . Number of children: N/A  . Years of education: N/A   Occupational History  . Not on file.   Social History Main Topics  . Smoking status: Never Smoker  . Smokeless tobacco: Never Used  . Alcohol use 0.0 oz/week     Comment: socially  . Drug use: No  . Sexual activity: Not on file   Other Topics Concern  . Not on file   Social History Narrative  . No narrative on file    Past Surgical History:  Procedure Laterality Date  . UMBILICAL HERNIA REPAIR Bilateral 01/2005    Family History  Problem Relation Age of Onset  . Cancer Mother   . Celiac disease Mother   . Heart attack Paternal Grandfather   . Arthritis Maternal Grandmother   . Colon cancer      great grandmother  . Lung cancer Mother   . High  Cholesterol Father   . Heart disease Maternal Grandfather   . High blood pressure Maternal Grandmother   . High blood pressure Paternal Grandmother   . Diabetes Maternal Grandfather   . Sjogren's syndrome Maternal Grandmother   . Syncope episode Father     micturition syncope  . Other Mother     AV node Tachycardia    Allergies  Allergen Reactions  . Chlorine     Trouble breathing     No current outpatient prescriptions on file prior to visit.   No current facility-administered medications on file prior to visit.     BP 136/86   Pulse 97   Temp 98 F (36.7 C) (Oral)   Ht 5' 8.5" (1.74 m)   Wt 200 lb 6.4 oz (90.9 kg)   LMP 09/26/2016   SpO2 99%   BMI 30.03 kg/m    Objective:   Physical Exam  Constitutional: She appears well-nourished. She appears ill.  HENT:  Right Ear: Tympanic membrane and ear canal normal.  Left Ear: Tympanic membrane and ear canal normal.  Nose: Right sinus exhibits no maxillary sinus tenderness and no frontal sinus tenderness. Left sinus exhibits no maxillary sinus tenderness and no frontal sinus tenderness.  Mouth/Throat: Oropharynx is clear and moist.  Eyes: Conjunctivae are normal.  Cardiovascular:  Normal rate and regular rhythm.   Pulmonary/Chest: Effort normal and breath sounds normal. She has no wheezes. She has no rales.  Lymphadenopathy:    She has cervical adenopathy.  Skin: Skin is warm and dry.          Assessment & Plan:  URI:  Cough, congestion, fatigue x 8 days, starting to feel worse. Little improvement with OTC treatment. Exam today with cervical adenopathy, clear lungs, does appear acutely ill. Given duration of symptoms, presentation, will treat for presumed bacterial involvement. Rx for Amoxil course and Tessalon Pearls sent to pharmacy. Fluids, rest, follow up PRN.  Morrie Sheldonlark,Halee Glynn Kendal, NP

## 2016-10-18 NOTE — Progress Notes (Signed)
Pre visit review using our clinic review tool, if applicable. No additional management support is needed unless otherwise documented below in the visit note. 

## 2016-10-18 NOTE — Patient Instructions (Signed)
Start amoxicillin antibiotics. Take 1 tablet by mouth twice daily for 7 days.  You may take Benzonatate capsules for cough. Take 1 capsule by mouth three times daily as needed for cough.  Ensure you are staying hydrated with water.  It was a pleasure meeting you!

## 2016-11-04 ENCOUNTER — Encounter: Payer: Self-pay | Admitting: Family Medicine

## 2016-11-04 ENCOUNTER — Ambulatory Visit (INDEPENDENT_AMBULATORY_CARE_PROVIDER_SITE_OTHER): Payer: BC Managed Care – PPO | Admitting: Family Medicine

## 2016-11-04 VITALS — BP 118/72 | Temp 98.8°F | Wt 200.5 lb

## 2016-11-04 DIAGNOSIS — R1033 Periumbilical pain: Secondary | ICD-10-CM | POA: Diagnosis not present

## 2016-11-04 NOTE — Progress Notes (Signed)
Subjective:    Patient ID: Tracey Garcia, female    DOB: Aug 30, 1986, 30 y.o.   MRN: [id number]  HPI This is a 30 yo female who presents today with intermittent abdominal pain. Pain around umbilicus, does not radiate. Pain occasionally sharp, early today was up to 8/10, eventually faded for a couple of hours until lunch. Currently pain 4/10. Seems like it started after antibiotic usage (amoxicillin 500 mg po BID x 7 days prescribed 10/18/16). Had diarrhea last week but her BMs have been closer to normal for her this week- usually BM every day. No blood in bowel movement. No fever. No pain into shoulders. No dysuria, no frequency, no hematuria. No interference in sleep. No relation to food. No nausea or vomiting, no decreased appetite. Took an ibuprofen last week, helped some. Relief with lying down and wearing loose fitting pants. Has IUD and has short periods. Had infection with initial IUD insertion and this pain is not similar (that pain was much more severe and lower). Has been diagnosed with IBS in past and has episodes of fluctuating diarrhea/constipation.    Past Medical History:  Diagnosis Date  . Anxiety   . Anxiety   . History of IBS   . History of seasonal allergies    Past Surgical History:  Procedure Laterality Date  . UMBILICAL HERNIA REPAIR Bilateral 01/2005   Family History  Problem Relation Age of Onset  . Cancer Mother   . Celiac disease Mother   . Heart attack Paternal Grandfather   . Arthritis Maternal Grandmother   . Colon cancer      great grandmother  . Lung cancer Mother   . High Cholesterol Father   . Heart disease Maternal Grandfather   . High blood pressure Maternal Grandmother   . High blood pressure Paternal Grandmother   . Diabetes Maternal Grandfather   . Sjogren's syndrome Maternal Grandmother   . Syncope episode Father     micturition syncope  . Other Mother     AV node Tachycardia   Social History  Substance Use Topics  . Smoking status:  Never Smoker  . Smokeless tobacco: Never Used  . Alcohol use 0.0 oz/week     Comment: socially      Review of Systems Per HPI    Objective:   Physical Exam  Constitutional: She is oriented to person, place, and time. She appears well-developed and well-nourished. No distress.  HENT:  Head: Normocephalic and atraumatic.  Eyes: Conjunctivae are normal.  Cardiovascular: Normal rate, regular rhythm and normal heart sounds.   Pulmonary/Chest: Effort normal and breath sounds normal.  Abdominal: Soft. Bowel sounds are normal. She exhibits no distension. There is no tenderness. There is no rebound and no guarding.  Neurological: She is alert and oriented to person, place, and time.  Skin: Skin is warm and dry. She is not diaphoretic.  Psychiatric: She has a normal mood and affect. Her behavior is normal. Judgment and thought content normal.  Vitals reviewed.     BP 118/72   Temp 98.8 F (37.1 C) (Oral)   Wt 200 lb 8 oz (90.9 kg)   LMP 11/03/2016   BMI 30.04 kg/m  Wt Readings from Last 3 Encounters:  11/04/16 200 lb 8 oz (90.9 kg)  10/18/16 200 lb 6.4 oz (90.9 kg)  08/17/16 197 lb 12.8 oz (89.7 kg)       Assessment & Plan:  1. Periumbilical abdominal pain - reassuring exam and improvement with a single dose of  ibuprofen.  - recommended she start a probiotic and take ibuprofen 400 mg every 8-12 hours, avoid tight fitting pants - RTC if develops fever, return of diarrhea, nausea/vomiting, urinary or vaginal symptoms or if no improvement in 4-5 days.   Olean Reeeborah Gessner, FNP-BC  [facility]  11/04/2016 3:05 PM

## 2016-11-04 NOTE — Progress Notes (Signed)
Pre visit review using our clinic review tool, if applicable. No additional management support is needed unless otherwise documented below in the visit note. 

## 2016-11-04 NOTE — Patient Instructions (Signed)
Please start a daily probiotic Try ibuprofen 1-2 tablets every 8-12 hours for pain If you develop fever, worsening pain, more diarrhea please let me know

## 2016-11-06 ENCOUNTER — Encounter (HOSPITAL_COMMUNITY): Payer: Self-pay | Admitting: Emergency Medicine

## 2016-11-06 ENCOUNTER — Ambulatory Visit (HOSPITAL_COMMUNITY)
Admission: EM | Admit: 2016-11-06 | Discharge: 2016-11-06 | Disposition: A | Discharge status: Home / Self Care | Payer: BC Managed Care – PPO | Attending: Emergency Medicine | Admitting: Emergency Medicine

## 2016-11-06 DIAGNOSIS — Z79899 Other long term (current) drug therapy: Secondary | ICD-10-CM | POA: Insufficient documentation

## 2016-11-06 DIAGNOSIS — R102 Pelvic and perineal pain: Secondary | ICD-10-CM

## 2016-11-06 DIAGNOSIS — Z975 Presence of (intrauterine) contraceptive device: Secondary | ICD-10-CM | POA: Insufficient documentation

## 2016-11-06 DIAGNOSIS — N3001 Acute cystitis with hematuria: Secondary | ICD-10-CM | POA: Diagnosis not present

## 2016-11-06 DIAGNOSIS — R112 Nausea with vomiting, unspecified: Secondary | ICD-10-CM | POA: Insufficient documentation

## 2016-11-06 DIAGNOSIS — R1030 Lower abdominal pain, unspecified: Secondary | ICD-10-CM | POA: Insufficient documentation

## 2016-11-06 DIAGNOSIS — R109 Unspecified abdominal pain: Secondary | ICD-10-CM | POA: Diagnosis present

## 2016-11-06 LAB — POCT URINALYSIS DIP (DEVICE)
Glucose, UA: NEGATIVE mg/dL
KETONES UR: 40 mg/dL — AB
Nitrite: NEGATIVE
PH: 5.5 (ref 5.0–8.0)
PROTEIN: NEGATIVE mg/dL
SPECIFIC GRAVITY, URINE: 1.025 (ref 1.005–1.030)
Urobilinogen, UA: 0.2 mg/dL (ref 0.0–1.0)

## 2016-11-06 LAB — POCT PREGNANCY, URINE: PREG TEST UR: NEGATIVE

## 2016-11-06 MED ORDER — SULFAMETHOXAZOLE-TRIMETHOPRIM 800-160 MG PO TABS: 1.0000 | ORAL_TABLET | Freq: Two times a day (BID) | ORAL | 0 refills | Status: AC

## 2016-11-06 MED ORDER — ONDANSETRON HCL 4 MG PO TABS: 4.0000 mg | ORAL_TABLET | Freq: Three times a day (TID) | ORAL | 0 refills | Status: DC | PRN

## 2016-11-06 MED ORDER — KETOROLAC TROMETHAMINE 30 MG/ML IJ SOLN
INTRAMUSCULAR | Status: AC
  Filled 2016-11-06: qty 1

## 2016-11-06 NOTE — ED Triage Notes (Signed)
C/o constant abd pain onset 1 week ... Hx of IBS  Reports she saw PCP on Friday   Sx today include: nausea, vomiting, loss of appetite   Reports she had "a lot" of alcohol last night   Denies fevers, urinary sx  A&O x4... NAD

## 2016-11-06 NOTE — Discharge Instructions (Signed)
Start Zofran 4mg  every 8 hours as needed for nausea/vomiting. May try Gatorade/Powerade and advance diet as tolerated. Start Bactrim twice a day as directed. Follow-up pending urine culture results. If symptoms worsen, go to ER.

## 2016-11-07 NOTE — ED Provider Notes (Signed)
CSN: [id number]     Arrival date & time 11/06/16  1845 History   First MD Initiated Contact with Patient 11/06/16 1949     Chief Complaint  Patient presents with  . Abdominal Pain   (Consider location/radiation/quality/duration/timing/severity/associated sxs/prior Treatment) 30 year old female accompanied by a significant other presents with central lower abdominal pain for the past week. Started as lower central pain that is a dull constant ache. Has history of IBS but pain is different than usual cramping and pain that she experiences. She thought pain may be related to recent Amoxicillin use for URI symptoms. She went to her PCP 2 days ago and they recommended Ibuprofen and probiotics. No additional testing was done. She had some improvement with Ibuprofen yesterday morning but now having more severe pain with both epigastric and lower central pain after drinking significant amount of alcohol last night. She has been nauseous and has vomited 3 to 4 times today. She has been trying to drink fluids and take Peptobismul with limited success. She denies any fever, chest pain, constipation, burning with urination, back pain or unusual vaginal discharge. She has an IUD in place and she spotted 4 days ago but no bleeding now. Significant other very concerned over etiology and asking multiple questions.    The history is provided by the patient and a significant other.  Abdominal Pain  Pain location:  Epigastric and suprapubic Pain quality: aching, dull, fullness and sharp   Pain radiates to:  Does not radiate Pain severity:  Moderate Onset quality:  Gradual Duration:  1 week Timing:  Constant Progression:  Worsening Chronicity:  New Context: alcohol use and recent illness   Context: not eating, not previous surgeries, not sick contacts, not suspicious food intake and not trauma   Relieved by:  Lying down, NSAIDs and position changes Worsened by:  Position changes Ineffective treatments:   NSAIDs Associated symptoms: diarrhea, fatigue, nausea and vomiting   Associated symptoms: no chest pain, no chills, no constipation, no cough, no dysuria, no fever, no flatus, no hematemesis, no hematochezia, no hematuria, no shortness of breath, no sore throat, no vaginal bleeding and no vaginal discharge     Past Medical History:  Diagnosis Date  . Anxiety   . Anxiety   . History of IBS   . History of seasonal allergies    Past Surgical History:  Procedure Laterality Date  . UMBILICAL HERNIA REPAIR Bilateral 01/2005   Family History  Problem Relation Age of Onset  . Cancer Mother   . Celiac disease Mother   . Lung cancer Mother   . Other Mother     AV node Tachycardia  . Heart attack Paternal Grandfather   . Arthritis Maternal Grandmother   . High blood pressure Maternal Grandmother   . Sjogren's syndrome Maternal Grandmother   . Colon cancer      great grandmother  . High Cholesterol Father   . Syncope episode Father     micturition syncope  . Heart disease Maternal Grandfather   . Diabetes Maternal Grandfather   . High blood pressure Paternal Grandmother    Social History  Substance Use Topics  . Smoking status: Never Smoker  . Smokeless tobacco: Never Used  . Alcohol use 0.0 oz/week     Comment: socially   OB History    No data available     Review of Systems  Constitutional: Positive for fatigue. Negative for appetite change, chills and fever.  HENT: Positive for postnasal drip. Negative  for sore throat.   Respiratory: Negative for cough, chest tightness and shortness of breath.   Cardiovascular: Negative for chest pain.  Gastrointestinal: Positive for abdominal pain, diarrhea, nausea and vomiting. Negative for blood in stool, constipation, flatus, hematemesis and hematochezia.  Genitourinary: Negative for difficulty urinating, dysuria, flank pain, frequency, hematuria, pelvic pain, vaginal bleeding and vaginal discharge.  Musculoskeletal: Negative for back  pain, neck pain and neck stiffness.  Neurological: Negative for dizziness, weakness, numbness and headaches.  Hematological: Negative for adenopathy.    Allergies  Chlorine  Home Medications   Prior to Admission medications   Medication Sig Start Date End Date Taking? Authorizing Provider  ondansetron (ZOFRAN) 4 MG tablet Take 1 tablet (4 mg total) by mouth every 8 (eight) hours as needed for nausea or vomiting. 11/06/16   Sudie GrumblingAnn Berry Sallie Staron, NP  sulfamethoxazole-trimethoprim (BACTRIM DS,SEPTRA DS) 800-160 MG tablet Take 1 tablet by mouth 2 (two) times daily. 11/06/16 11/13/16  Sudie GrumblingAnn Berry Magalie Almon, NP   Meds Ordered and Administered this Visit  Medications - No data to display  BP 120/63 (BP Location: Left Arm)   Pulse 85   Temp 98.7 F (37.1 C) (Oral)   Resp 20   LMP 11/03/2016   SpO2 100%  No data found.   Physical Exam  Constitutional: She is oriented to person, place, and time. She appears well-developed and well-nourished. No distress.  HENT:  Head: Normocephalic and atraumatic.  Nose: Nose normal.  Mouth/Throat: Oropharynx is clear and moist.  Neck: Normal range of motion. Neck supple.  Cardiovascular: Normal rate, regular rhythm and normal heart sounds.   Pulmonary/Chest: Effort normal and breath sounds normal. No respiratory distress. She has no wheezes.  Abdominal: Soft. Normal appearance and bowel sounds are normal. She exhibits no distension and no mass. There is no hepatosplenomegaly. There is tenderness in the epigastric area and suprapubic area. There is no rigidity, no rebound, no guarding and no CVA tenderness.  Lymphadenopathy:    She has no cervical adenopathy.  Neurological: She is alert and oriented to person, place, and time.  Skin: Skin is warm and dry. Capillary refill takes less than 2 seconds.  Psychiatric: She has a normal mood and affect. Her behavior is normal. Judgment and thought content normal.    Urgent Care Course   Clinical Course      Procedures (including critical care time)  Labs Review Labs Reviewed  POCT URINALYSIS DIP (DEVICE) - Abnormal; Notable for the following:       Result Value   Bilirubin Urine SMALL (*)    Ketones, ur 40 (*)    Hgb urine dipstick MODERATE (*)    Leukocytes, UA TRACE (*)    All other components within normal limits  URINE CULTURE  POCT PREGNANCY, URINE    Imaging Review No results found.   Visual Acuity Review  Right Eye Distance:   Left Eye Distance:   Bilateral Distance:    Right Eye Near:   Left Eye Near:    Bilateral Near:         MDM   1. Suprapubic abdominal pain   2. Acute cystitis with hematuria   3. Nausea and vomiting, intractability of vomiting not specified, unspecified vomiting type    Reviewed urinalysis results with patient and her significant other. Moderate amount of blood in her urine may be due to UTI but also could be renal calculi or other etiology. Discussed that suprapubic pain may be caused by UTI but may be due to  other etiologies. Discussed starting antibiotics for UTI or waiting until culture results were available. Patient requests to start antibiotics for now and will adjust based on culture results. Recommend Bactrim DS twice a day for 7 days. Reviewed that epigastric pain most likely due to gastritis from over consumption of alcohol. May take Zofran 4mg  every 8 hours as needed for nausea and vomiting. Encouraged to increase fluids today - particularly Gatorade or Powerade and advance diet as tolerated. Reviewed that multiple etiologies can be causing pain and symptoms. Discussed various diagnoses and tests for over 15 minutes with patient and significant other. Recommend follow-up pending urine culture results and with her PCP in 2 days for additional testing if pain does not improve or go to ER if pain worsens.     Sudie Grumbling, NP 11/07/16 [phone]

## 2016-11-08 LAB — URINE CULTURE: Special Requests: NORMAL

## 2016-11-10 ENCOUNTER — Telehealth (HOSPITAL_COMMUNITY): Payer: Self-pay | Admitting: Emergency Medicine

## 2016-11-10 NOTE — Telephone Encounter (Signed)
LM on [phone] Called to give lab results and to see how pt is doing from recent visit on 11/06/16 Notified pt in gen message of normal results and that there is NO need to call back Unless not feeling any better, not tolerating meds well or if wanting to know lab results. Also let pt know labs can be obtained from MyChart

## 2016-11-10 NOTE — Telephone Encounter (Signed)
-----   Message from Tracey MooreLaura W Murray, MD sent at 11/08/2016  3:35 PM EST ----- Clinical staff, please let patient know that urine culture does not clearly demonstrate a UTI.  Recheck or followup with primary care provider for further evaluation if symptoms not improving.  LM

## 2016-11-10 NOTE — Telephone Encounter (Signed)
Pt called back... notified of recent lab results Pt ID'd properly... Reports feeling better and sx have subsided Pt is taking/tolarating well meds given at visit.  Adv pt if sx are not getting better to return or to f/u w/PCP Pt verb understanding.      

## 2016-12-15 ENCOUNTER — Encounter: Payer: Self-pay | Admitting: Primary Care

## 2016-12-15 ENCOUNTER — Ambulatory Visit (INDEPENDENT_AMBULATORY_CARE_PROVIDER_SITE_OTHER): Payer: BC Managed Care – PPO | Admitting: Primary Care

## 2016-12-15 ENCOUNTER — Telehealth: Payer: Self-pay

## 2016-12-15 VITALS — BP 144/90 | HR 76 | Temp 99.0°F | Ht 68.5 in | Wt 202.8 lb

## 2016-12-15 DIAGNOSIS — H578 Other specified disorders of eye and adnexa: Secondary | ICD-10-CM | POA: Diagnosis not present

## 2016-12-15 DIAGNOSIS — H5789 Other specified disorders of eye and adnexa: Secondary | ICD-10-CM

## 2016-12-15 MED ORDER — CIPROFLOXACIN HCL 0.3 % OP SOLN: OPHTHALMIC | 0 refills | Status: DC

## 2016-12-15 NOTE — Progress Notes (Signed)
Pre visit review using our clinic review tool, if applicable. No additional management support is needed unless otherwise documented below in the visit note. 

## 2016-12-15 NOTE — Progress Notes (Signed)
Subjective:    Patient ID: Tracey Garcia, female    DOB: [birth year], 31 y.o.   MRN: [id number]  HPI  Tracey Garcia is a 31 year old female with a history of allergic rhinitis and recurrent sinusitis who presents today with a chief complaint of eye irritation. Her irriation is located bilaterally but more so to the right eye. She also reports redness, watery eyes, and photophobia. She first noticed these symptoms yesterday morning when she put her contacts in place. She denies itchy eyes, yellow/green drainage, cough, fevers, sore throat. She's been taking Zyrtec which she takes daily.  Review of Systems  Constitutional: Negative for fever.  HENT: Negative for sore throat.   Eyes: Positive for photophobia, pain and redness. Negative for discharge and itching.  Respiratory: Negative for cough.   Allergic/Immunologic: Positive for environmental allergies.       Past Medical History:  Diagnosis Date  . Anxiety   . Anxiety   . History of IBS   . History of seasonal allergies      Social History   Social History  . Marital status: Married    Spouse name: N/A  . Number of children: N/A  . Years of education: N/A   Occupational History  . Not on file.   Social History Main Topics  . Smoking status: Never Smoker  . Smokeless tobacco: Never Used  . Alcohol use 0.0 oz/week     Comment: socially  . Drug use: No  . Sexual activity: Not on file   Other Topics Concern  . Not on file   Social History Narrative  . No narrative on file    Past Surgical History:  Procedure Laterality Date  . UMBILICAL HERNIA REPAIR Bilateral 01/2005    Family History  Problem Relation Age of Onset  . Cancer Mother   . Celiac disease Mother   . Lung cancer Mother   . Other Mother     AV node Tachycardia  . Heart attack Paternal Grandfather   . Arthritis Maternal Grandmother   . High blood pressure Maternal Grandmother   . Sjogren's syndrome Maternal Grandmother   . Colon cancer     great grandmother  . High Cholesterol Father   . Syncope episode Father     micturition syncope  . Heart disease Maternal Grandfather   . Diabetes Maternal Grandfather   . High blood pressure Paternal Grandmother     Allergies  Allergen Reactions  . Chlorine     Trouble breathing     No current outpatient prescriptions on file prior to visit.   No current facility-administered medications on file prior to visit.     BP (!) 144/90   Pulse 76   Temp 99 F (37.2 C) (Oral)   Ht 5' 8.5" (1.74 m)   Wt 202 lb 12.8 oz (92 kg)   LMP 11/29/2016   SpO2 99%   BMI 30.39 kg/m    Objective:   Physical Exam  Constitutional: She appears well-nourished.  Eyes: Pupils are equal, round, and reactive to light. Right eye exhibits no discharge. No foreign body present in the right eye. Left eye exhibits no discharge. No foreign body present in the left eye. Right conjunctiva is injected. Left conjunctiva is injected.  Cardiovascular: Normal rate and regular rhythm.   Pulmonary/Chest: Effort normal and breath sounds normal.  Skin: Skin is warm and dry.          Assessment & Plan:  Eye Irritation:  Redness, pain,  photophobia x 24 hours. Wears contacts, cannot today due to discomfort. Exam today with moderate injection to right eye, no drainage. Possible corneal abrasion as there is no evidence for conjunctivitis or allergy involvement. Will cover with ciprofloxacin drops. She will follow-up with her eye doctor if no improvement in 3-4 days.  Tracey Garcia,Tracey Kendal, NP

## 2016-12-15 NOTE — Telephone Encounter (Signed)
Pt called to verify instructions for eye drops; Jae DireKate said 1 gtt as instructions on med list are OK. Pt voiced understanding. Pt also request cb; pt wants to know if there is infection or just irritation in her eyes and when can pt wear contacts again.

## 2016-12-15 NOTE — Patient Instructions (Addendum)
Start Ciprofloxacin eye drops for eye irritation.   Administer 1-2 drops every 2 hours while awake for 2 days, the 1-2 drops every 4 hours for the next 5 days.  Please follow up with your eye doctor if no improvement in 3-4 days or if your symptoms become worse.  It was a pleasure meeting you!

## 2016-12-15 NOTE — Telephone Encounter (Signed)
Refrain from contact use for the next week if possible, until symptoms clear.

## 2016-12-16 NOTE — Telephone Encounter (Signed)
Message left for patient to return my call.  

## 2016-12-16 NOTE — Telephone Encounter (Signed)
Spoken and notified patient of Kate's comments. Patient verbalized understanding. 

## 2017-08-13 ENCOUNTER — Telehealth: Payer: Self-pay | Admitting: Family Medicine

## 2017-08-13 DIAGNOSIS — Z0000 Encounter for general adult medical examination without abnormal findings: Secondary | ICD-10-CM

## 2017-08-13 NOTE — Telephone Encounter (Signed)
Future labs 

## 2017-08-17 ENCOUNTER — Encounter (INDEPENDENT_AMBULATORY_CARE_PROVIDER_SITE_OTHER): Payer: Self-pay

## 2017-08-17 ENCOUNTER — Other Ambulatory Visit (INDEPENDENT_AMBULATORY_CARE_PROVIDER_SITE_OTHER): Payer: BC Managed Care – PPO

## 2017-08-17 DIAGNOSIS — Z0000 Encounter for general adult medical examination without abnormal findings: Secondary | ICD-10-CM | POA: Diagnosis not present

## 2017-08-17 LAB — CBC WITH DIFFERENTIAL/PLATELET
BASOS ABS: 0 10*3/uL (ref 0.0–0.1)
Basophils Relative: 0.6 % (ref 0.0–3.0)
EOS ABS: 0.2 10*3/uL (ref 0.0–0.7)
Eosinophils Relative: 4.4 % (ref 0.0–5.0)
HCT: 45 % (ref 36.0–46.0)
Hemoglobin: 15.1 g/dL — ABNORMAL HIGH (ref 12.0–15.0)
LYMPHS ABS: 1.6 10*3/uL (ref 0.7–4.0)
Lymphocytes Relative: 38.2 % (ref 12.0–46.0)
MCHC: 33.5 g/dL (ref 30.0–36.0)
MCV: 93.5 fl (ref 78.0–100.0)
MONO ABS: 0.4 10*3/uL (ref 0.1–1.0)
Monocytes Relative: 8.9 % (ref 3.0–12.0)
NEUTROS ABS: 2 10*3/uL (ref 1.4–7.7)
NEUTROS PCT: 47.9 % (ref 43.0–77.0)
PLATELETS: 257 10*3/uL (ref 150.0–400.0)
RBC: 4.82 Mil/uL (ref 3.87–5.11)
RDW: 12.6 % (ref 11.5–15.5)
WBC: 4.2 10*3/uL (ref 4.0–10.5)

## 2017-08-17 LAB — COMPREHENSIVE METABOLIC PANEL
ALT: 14 U/L (ref 0–35)
AST: 18 U/L (ref 0–37)
Albumin: 4.3 g/dL (ref 3.5–5.2)
Alkaline Phosphatase: 59 U/L (ref 39–117)
BILIRUBIN TOTAL: 0.7 mg/dL (ref 0.2–1.2)
BUN: 13 mg/dL (ref 6–23)
CO2: 29 meq/L (ref 19–32)
CREATININE: 0.82 mg/dL (ref 0.40–1.20)
Calcium: 9.3 mg/dL (ref 8.4–10.5)
Chloride: 102 mEq/L (ref 96–112)
GFR: 86.81 mL/min (ref >60.00)
GLUCOSE: 92 mg/dL (ref 70–99)
Potassium: 4.1 mEq/L (ref 3.5–5.1)
Sodium: 137 mEq/L (ref 135–145)
TOTAL PROTEIN: 6.7 g/dL (ref 6.0–8.3)

## 2017-08-17 LAB — LIPID PANEL
CHOL/HDL RATIO: 2
Cholesterol: 179 mg/dL (ref 0–200)
HDL: 89.8 mg/dL (ref >39.00)
LDL Cholesterol: 78 mg/dL (ref 0–99)
NonHDL: 89.65
Triglycerides: 57 mg/dL (ref 0.0–149.0)
VLDL: 11.4 mg/dL (ref 0.0–40.0)

## 2017-08-17 LAB — TSH: TSH: 2.98 u[IU]/mL (ref 0.35–4.50)

## 2017-08-23 ENCOUNTER — Encounter: Payer: Self-pay | Admitting: Family Medicine

## 2017-08-23 ENCOUNTER — Ambulatory Visit (INDEPENDENT_AMBULATORY_CARE_PROVIDER_SITE_OTHER): Payer: BC Managed Care – PPO | Admitting: Family Medicine

## 2017-08-23 VITALS — BP 122/68 | HR 102 | Temp 98.3°F | Ht 68.0 in | Wt 208.5 lb

## 2017-08-23 DIAGNOSIS — Z0000 Encounter for general adult medical examination without abnormal findings: Secondary | ICD-10-CM

## 2017-08-23 DIAGNOSIS — R21 Rash and other nonspecific skin eruption: Secondary | ICD-10-CM | POA: Insufficient documentation

## 2017-08-23 DIAGNOSIS — M2550 Pain in unspecified joint: Secondary | ICD-10-CM | POA: Diagnosis not present

## 2017-08-23 DIAGNOSIS — Z8371 Family history of colonic polyps: Secondary | ICD-10-CM

## 2017-08-23 DIAGNOSIS — K589 Irritable bowel syndrome without diarrhea: Secondary | ICD-10-CM

## 2017-08-23 NOTE — Patient Instructions (Addendum)
Try to get most of your carbohydrates from produce (with the exception of white potatoes)  Eat less bread/pasta/rice/snack foods/cereals/sweets and other items from the middle of the grocery store (processed carbs)  Take vitamin D3 over the counter 1000 iu daily  Ibuprofen or aleve with food is ok for joint pain when you need it   Stop at check out for follow up appt
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
## 2017-08-24 ENCOUNTER — Encounter: Payer: Self-pay | Admitting: Family Medicine

## 2017-08-24 NOTE — Assessment & Plan Note (Signed)
Pt is concerned she may have celiac or gluten intolerance Mother has it  She is experimenting with gluten free eating  Will continue to follow Labs before next f/u  May need colonoscopy with bx

## 2017-08-24 NOTE — Assessment & Plan Note (Signed)
Reviewed health habits including diet and exercise and skin cancer prevention Reviewed appropriate screening tests for age  Also reviewed health mt list, fam hx and immunization status , as well as social and family history   See HPI Labs reviewed  utd gyn care  Declines flu vaccine

## 2017-08-24 NOTE — Assessment & Plan Note (Signed)
She will f/u for this with lab prior

## 2017-08-24 NOTE — Assessment & Plan Note (Signed)
Intermittent rash on fingers  ? Eczema  Not present today  Will f/u when it re occurs  Enc her to avoid hot water and harsh detergents

## 2017-08-24 NOTE — Assessment & Plan Note (Signed)
Aware-father had high risk polyp  She will need first colonoscopy at 36 for screenign

## 2017-08-25 ENCOUNTER — Other Ambulatory Visit (INDEPENDENT_AMBULATORY_CARE_PROVIDER_SITE_OTHER): Payer: BC Managed Care – PPO

## 2017-08-25 DIAGNOSIS — M2550 Pain in unspecified joint: Secondary | ICD-10-CM

## 2017-08-25 DIAGNOSIS — K589 Irritable bowel syndrome without diarrhea: Secondary | ICD-10-CM | POA: Diagnosis not present

## 2017-08-25 LAB — SEDIMENTATION RATE: Sed Rate: 6 mm/hr (ref 0–20)

## 2017-08-28 LAB — ANA: ANA: NEGATIVE

## 2017-08-28 LAB — RHEUMATOID FACTOR: Rhuematoid fact SerPl-aCnc: <14 IU/mL (ref <14)

## 2017-08-30 ENCOUNTER — Ambulatory Visit (INDEPENDENT_AMBULATORY_CARE_PROVIDER_SITE_OTHER)
Admission: RE | Admit: 2017-08-30 | Discharge: 2017-08-30 | Disposition: A | Discharge status: Home / Self Care | Payer: BC Managed Care – PPO | Source: Ambulatory Visit | Attending: Family Medicine | Admitting: Family Medicine

## 2017-08-30 ENCOUNTER — Ambulatory Visit (INDEPENDENT_AMBULATORY_CARE_PROVIDER_SITE_OTHER): Payer: BC Managed Care – PPO | Admitting: Family Medicine

## 2017-08-30 ENCOUNTER — Encounter: Payer: Self-pay | Admitting: Family Medicine

## 2017-08-30 VITALS — BP 118/74 | HR 75 | Temp 98.4°F | Ht 68.0 in | Wt 210.5 lb

## 2017-08-30 DIAGNOSIS — M25561 Pain in right knee: Secondary | ICD-10-CM | POA: Diagnosis not present

## 2017-08-30 DIAGNOSIS — K589 Irritable bowel syndrome without diarrhea: Secondary | ICD-10-CM | POA: Diagnosis not present

## 2017-08-30 DIAGNOSIS — G8929 Other chronic pain: Secondary | ICD-10-CM

## 2017-08-30 DIAGNOSIS — M25579 Pain in unspecified ankle and joints of unspecified foot: Secondary | ICD-10-CM | POA: Diagnosis not present

## 2017-08-30 DIAGNOSIS — M2550 Pain in unspecified joint: Secondary | ICD-10-CM | POA: Diagnosis not present
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
## 2017-08-30 NOTE — Patient Instructions (Addendum)
Try aleve 2 pills with food up to every 12 hours for joint pain  Get some patellar bands (sporting good store) to wear on knees for exercise and walking  Use ice on knees or ankles when they are bothersome  Xray of R knee today   Take a look at the hand out re: knee exercises  Physical therapy may help in the future   We will schedule an appt with Dr Patsy Lager for foot and ankle issues

## 2017-08-31 ENCOUNTER — Telehealth: Payer: Self-pay | Admitting: Family Medicine

## 2017-08-31 NOTE — Assessment & Plan Note (Signed)
Fairly nl exam  Pain is always after inactivity  Ref to sport med for eval

## 2017-08-31 NOTE — Assessment & Plan Note (Signed)
Neg autoimmune labs  Will w/u individual problems

## 2017-08-31 NOTE — Telephone Encounter (Signed)
PT returned your call and is requesting a cb. °
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
## 2017-08-31 NOTE — Telephone Encounter (Signed)
Addressed through result notes  

## 2017-08-31 NOTE — Assessment & Plan Note (Signed)
Neg celiac initial /serum screening tests  Pt will try avoiding dairy to see if this helps May be gluten intolerant-will experiment with this  Consider GI ref if symptoms worsen

## 2017-09-01 LAB — CELIAC PNL 2 RFLX ENDOMYSIAL AB TTR
(tTG) Ab, IgA: <1 U/mL
(tTG) Ab, IgG: <1 U/mL
ENDOMYSIAL AB IGA: NEGATIVE
GLIADIN(DEAM) AB,IGG: 4 U (ref <20)
Gliadin(Deam) Ab,IgA: 6 U (ref <20)
Immunoglobulin A: 200 mg/dL (ref 81–463)

## 2017-09-04 ENCOUNTER — Ambulatory Visit: Payer: Self-pay | Admitting: Family Medicine

## 2017-09-18 ENCOUNTER — Encounter: Payer: Self-pay | Admitting: Family Medicine

## 2017-09-18 ENCOUNTER — Ambulatory Visit (INDEPENDENT_AMBULATORY_CARE_PROVIDER_SITE_OTHER): Payer: BC Managed Care – PPO | Admitting: Family Medicine

## 2017-09-18 VITALS — BP 104/80 | HR 82 | Temp 98.3°F | Ht 68.0 in | Wt 206.8 lb

## 2017-09-18 DIAGNOSIS — M7050 Other bursitis of knee, unspecified knee: Secondary | ICD-10-CM | POA: Diagnosis not present

## 2017-09-18 DIAGNOSIS — M2142 Flat foot [pes planus] (acquired), left foot: Secondary | ICD-10-CM

## 2017-09-18 DIAGNOSIS — M25579 Pain in unspecified ankle and joints of unspecified foot: Secondary | ICD-10-CM

## 2017-09-18 DIAGNOSIS — M2141 Flat foot [pes planus] (acquired), right foot: Secondary | ICD-10-CM

## 2017-09-18 MED ORDER — PREDNISONE 20 MG PO TABS: ORAL_TABLET | ORAL | 0 refills | Status: DC
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
## 2017-09-18 NOTE — Patient Instructions (Signed)
Alleve 2 tabs by mouth two times a day over the counter: Take at least for 2 - 3 weeks. This is equal to a prescripton strength dose (GENERIC CHEAPER EQUIVALENT IS NAPROXEN SODIUM)    

## 2017-11-07 ENCOUNTER — Ambulatory Visit: Payer: BC Managed Care – PPO | Admitting: Internal Medicine

## 2017-11-07 ENCOUNTER — Encounter: Payer: Self-pay | Admitting: Internal Medicine

## 2017-11-07 VITALS — BP 122/80 | HR 81 | Temp 98.2°F | Wt 208.0 lb

## 2017-11-07 DIAGNOSIS — J029 Acute pharyngitis, unspecified: Secondary | ICD-10-CM

## 2017-11-07 DIAGNOSIS — J0100 Acute maxillary sinusitis, unspecified: Secondary | ICD-10-CM

## 2017-11-07 LAB — POCT RAPID STREP A (OFFICE): RAPID STREP A SCREEN: NEGATIVE

## 2017-11-07 MED ORDER — AMOXICILLIN 875 MG PO TABS: 875.0000 mg | ORAL_TABLET | Freq: Two times a day (BID) | ORAL | 0 refills | Status: DC

## 2017-11-07 MED ORDER — HYDROCOD POLST-CPM POLST ER 10-8 MG/5ML PO SUER: 5.0000 mL | Freq: Every evening | ORAL | 0 refills | Status: DC | PRN

## 2017-11-07 MED ORDER — METHYLPREDNISOLONE ACETATE 80 MG/ML IJ SUSP
80.0000 mg | Freq: Once | INTRAMUSCULAR | Status: AC
  Administered 2017-11-07: 80 mg via INTRAMUSCULAR

## 2017-11-07 NOTE — Progress Notes (Signed)
HPI  Pt presents to the clinic today with c/o facial pressure, nasal congestion and sore throat. She reports this started 1 week ago. She is blowing yellow/green mucous out of her nose. She denies difficulty swallowing. She denies fever, chills or body aches, but has felt fatigued. She has tried Mucinex and Robitussin with some relief. She has a history of seasonal allergies. She has had sick contacts.  Review of Systems     Past Medical History:  Diagnosis Date  . Anxiety   . Anxiety   . History of IBS   . History of seasonal allergies     Family History  Problem Relation Age of Onset  . Cancer Mother   . Celiac disease Mother   . Lung cancer Mother   . Other Mother        AV node Tachycardia  . Heart attack Paternal Grandfather   . Arthritis Maternal Grandmother   . High blood pressure Maternal Grandmother   . Sjogren's syndrome Maternal Grandmother   . Colon cancer Unknown        great grandmother  . High Cholesterol Father   . Syncope episode Father        micturition syncope  . Colon polyps Father   . Heart disease Maternal Grandfather   . Diabetes Maternal Grandfather   . High blood pressure Paternal Grandmother     Social History   Socioeconomic History  . Marital status: Married    Spouse name: Not on file  . Number of children: Not on file  . Years of education: Not on file  . Highest education level: Not on file  Social Needs  . Financial resource strain: Not on file  . Food insecurity - worry: Not on file  . Food insecurity - inability: Not on file  . Transportation needs - medical: Not on file  . Transportation needs - non-medical: Not on file  Occupational History  . Not on file  Tobacco Use  . Smoking status: Never Smoker  . Smokeless tobacco: Never Used  Substance and Sexual Activity  . Alcohol use: Yes    Alcohol/week: 0.0 oz    Comment: socially  . Drug use: No  . Sexual activity: Not on file  Other Topics Concern  . Not on file  Social  History Narrative  . Not on file    Allergies  Allergen Reactions  . Chlorine     Trouble breathing      Constitutional: Positive fatigue. Denies headache, fever or  abrupt weight changes.  HEENT:  Positive facial pain, nasal congestion and sore throat. Denies eye redness, ear pain, ringing in the ears, wax buildup, runny nose or bloody nose. Respiratory: Denies cough, difficulty breathing or shortness of breath.  Cardiovascular: Denies chest pain, chest tightness, palpitations or swelling in the hands or feet.   No other specific complaints in a complete review of systems (except as listed in HPI above).  Objective:  BP 122/80   Pulse 81   Temp 98.2 F (36.8 C) (Oral)   Wt 208 lb (94.3 kg)   SpO2 98%   BMI 31.63 kg/m   General: Appears her stated age, ill appearing, in NAD. HEENT: Head: normal shape and size, maxillary sinus tenderness noted;  Ears: Tm's red but intact, normal light reflex; Nose: mucosa boggy and moist, septum midline; Throat/Mouth: + PND. Teeth present, mucosa erythematous and moist, no exudate noted. Ulcerations noted on right tonsillar pillar.  Neck:  No adenopathy noted.  Pulmonary/Chest:  Normal effort and positive vesicular breath sounds. No respiratory distress. No wheezes, rales or ronchi noted.       Assessment & Plan:   Acute Maxillary Sinusitis:  Can use a Neti Pot which can be purchased from your local drug store. Flonase 2 sprays each nostril for 3 days and then as needed. eRx for Amoxil BID for 10 days  Sore Throat:  RST: negative Likely viral ulcerations 80 mg Depo IM today Salt water gargles and Ibuprofen as needed She declines RX for Magic Mouthwash with Lidocaine  RTC as needed or if symptoms persist. Tracey ReaperBAITY, REGINA, NP

## 2017-11-07 NOTE — Patient Instructions (Signed)

## 2017-11-07 NOTE — Addendum Note (Signed)
Addended by: Roena MaladyEVONTENNO, Shiesha Jahn Y on: 11/07/2017 04:44 PM   Modules accepted: Orders
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
## 2018-05-07 ENCOUNTER — Ambulatory Visit: Payer: BC Managed Care – PPO | Admitting: Family Medicine

## 2018-05-07 ENCOUNTER — Encounter: Payer: Self-pay | Admitting: Family Medicine

## 2018-05-07 VITALS — BP 128/88 | HR 104 | Temp 98.4°F | Ht 68.0 in | Wt 214.0 lb

## 2018-05-07 DIAGNOSIS — N3090 Cystitis, unspecified without hematuria: Secondary | ICD-10-CM | POA: Diagnosis not present

## 2018-05-07 DIAGNOSIS — R300 Dysuria: Secondary | ICD-10-CM | POA: Diagnosis not present

## 2018-05-07 LAB — POC URINALSYSI DIPSTICK (AUTOMATED)
BILIRUBIN UA: NEGATIVE
GLUCOSE UA: NEGATIVE
Ketones, UA: NEGATIVE
Nitrite, UA: POSITIVE
Protein, UA: NEGATIVE
RBC UA: 10
SPEC GRAV UA: 1.015 (ref 1.010–1.025)
UROBILINOGEN UA: 1 U/dL
pH, UA: 6 (ref 5.0–8.0)

## 2018-05-07 MED ORDER — NITROFURANTOIN MONOHYD MACRO 100 MG PO CAPS
100.0000 mg | ORAL_CAPSULE | Freq: Two times a day (BID) | ORAL | 0 refills | Status: DC
Start: 2018-05-07 — End: 2018-06-05

## 2018-05-07 NOTE — Patient Instructions (Signed)

## 2018-05-07 NOTE — Progress Notes (Signed)
w

## 2018-05-07 NOTE — Progress Notes (Signed)
Subjective:    Patient ID: Tracey Garcia, female    DOB: January 03, 1986, 32 y.o.   MRN: [id number]  HPI This is a 32 yo female who presents today with dysuria x 5 days. No fever/chills, no back pain, some low abdominal pain. No nausea/vomiting. Has taken AZO with some relief of symptoms. Some recent sexual intercourse, then worse some tight undergarments.   Past Medical History:  Diagnosis Date  . Anxiety   . Anxiety   . History of IBS   . History of seasonal allergies    Past Surgical History:  Procedure Laterality Date  . UMBILICAL HERNIA REPAIR Bilateral 01/2005   Family History  Problem Relation Age of Onset  . Cancer Mother   . Celiac disease Mother   . Lung cancer Mother   . Other Mother        AV node Tachycardia  . Heart attack Paternal Grandfather   . Arthritis Maternal Grandmother   . High blood pressure Maternal Grandmother   . Sjogren's syndrome Maternal Grandmother   . Colon cancer Unknown        great grandmother  . High Cholesterol Father   . Syncope episode Father        micturition syncope  . Colon polyps Father   . Heart disease Maternal Grandfather   . Diabetes Maternal Grandfather   . High blood pressure Paternal Grandmother    Social History   Tobacco Use  . Smoking status: Never Smoker  . Smokeless tobacco: Never Used  Substance Use Topics  . Alcohol use: Yes    Alcohol/week: 0.0 oz    Comment: socially  . Drug use: No      Review of Systems Per HPI    Objective:   Physical Exam Physical Exam  Constitutional: She is oriented to person, place, and time. She appears well-developed and well-nourished. No distress.  HENT:  Head: Normocephalic and atraumatic.  Cardiovascular: Normal rate, regular rhythm and normal heart sounds.   Pulmonary/Chest: Effort normal and breath sounds normal.  Abdominal: Soft. She exhibits no distension. There is no tenderness. There is no rebound, no guarding and no CVA tenderness.  Neurological: She is alert  and oriented to person, place, and time.  Skin: Skin is warm and dry. She is not diaphoretic.  Psychiatric: She has a normal mood and affect. Her behavior is normal. Judgment and thought content normal.  Vitals reviewed.     .BP 128/88 (Cuff Size: Large)   Pulse (!) 104   Temp 98.4 F (36.9 C) (Oral)   Ht 5\' 8"  (1.727 m)   Wt 214 lb (97.1 kg)   SpO2 97%   BMI 32.54 kg/m  Wt Readings from Last 3 Encounters:  05/07/18 214 lb (97.1 kg)  11/07/17 208 lb (94.3 kg)  09/18/17 206 lb 12 oz (93.8 kg)   Results for orders placed or performed in visit on 05/07/18  POCT Urinalysis Dipstick (Automated)  Result Value Ref Range   Color, UA Orange    Clarity, UA clear    Glucose, UA Negative Negative   Bilirubin, UA neg    Ketones, UA neg    Spec Grav, UA 1.015 1.010 - 1.025   Blood, UA 10    pH, UA 6.0 5.0 - 8.0   Protein, UA Negative Negative   Urobilinogen, UA 1.0 0.2 or 1.0 E.U./dL   Nitrite, UA positive    Leukocytes, UA Moderate (2+) (A) Negative       Assessment & Plan:  1. Dysuria - POCT Urinalysis Dipstick (Automated)  2. Cystitis - Provided written and verbal information regarding diagnosis and treatment. - RTC precautions reviewed - nitrofurantoin, macrocrystal-monohydrate, (MACROBID) 100 MG capsule; Take 1 capsule (100 mg total) by mouth 2 (two) times daily.  Dispense: 20 capsule; Refill: 0 - Urine Culture   Olean Ree, FNP-BC  [facility], [facility]  05/07/2018 9:42 PM

## 2018-05-09 LAB — URINE CULTURE
MICRO NUMBER:: 90664613
SPECIMEN QUALITY: ADEQUATE

## 2018-05-10 ENCOUNTER — Encounter: Payer: Self-pay | Admitting: Family Medicine

## 2018-05-17 ENCOUNTER — Telehealth: Payer: Self-pay | Admitting: *Deleted

## 2018-05-17 NOTE — Telephone Encounter (Signed)
Copied from CRM [id number]. Topic: Quick Communication - See Telephone Encounter >> May 17, 2018  7:50 AM Waymon AmatoBurton, Donna F wrote: Pt  has emailed Leone PayorGessner from my chart last nght about the bladder infection not going away but it has gotten worse over the night and she needs to know if she needs to be seen again  Or if something else can be done   Best number [phone]

## 2018-05-18 ENCOUNTER — Encounter: Payer: Self-pay | Admitting: Family Medicine

## 2018-05-18 ENCOUNTER — Ambulatory Visit: Payer: BC Managed Care – PPO | Admitting: Family Medicine

## 2018-05-18 VITALS — BP 102/76 | HR 75 | Temp 97.7°F | Wt 214.0 lb

## 2018-05-18 DIAGNOSIS — R300 Dysuria: Secondary | ICD-10-CM

## 2018-05-18 DIAGNOSIS — N3090 Cystitis, unspecified without hematuria: Secondary | ICD-10-CM

## 2018-05-18 DIAGNOSIS — A491 Streptococcal infection, unspecified site: Secondary | ICD-10-CM | POA: Diagnosis not present

## 2018-05-18 LAB — POC URINALSYSI DIPSTICK (AUTOMATED)
Bilirubin, UA: NEGATIVE
Blood, UA: NEGATIVE
Glucose, UA: NEGATIVE
Ketones, UA: NEGATIVE
NITRITE UA: NEGATIVE
PROTEIN UA: NEGATIVE
SPEC GRAV UA: 1.01 (ref 1.010–1.025)
UROBILINOGEN UA: 0.2 U/dL
pH, UA: 6 (ref 5.0–8.0)

## 2018-05-18 MED ORDER — SULFAMETHOXAZOLE-TRIMETHOPRIM 800-160 MG PO TABS
1.0000 | ORAL_TABLET | Freq: Two times a day (BID) | ORAL | 0 refills | Status: DC
Start: 2018-05-18 — End: 2018-06-05

## 2018-05-18 NOTE — Progress Notes (Signed)
Subjective:    Patient ID: Tracey Garcia, female    DOB: [birth year], 32 y.o.   MRN: [id number]  HPI This is a 32 yo female who presents today with recurrent dysuria. Was seen 05/07/18 and found to have cystitis, culture showe E Coli susceptible to nitrofurantoin.She was given nitrofurantoin which seemed to clear symptoms. Several days after completing antibiotic, she had recurrent dysuria, much milder. No fever/chills, abdominal pain/nausea/vomiting. Has not required AZO or felt terrible like she did previously.   No vaginal burning, discharge.  She is leaving for a week in [place] in 5 days.   Past Medical History:  Diagnosis Date  . Anxiety   . Anxiety   . History of IBS   . History of seasonal allergies    Past Surgical History:  Procedure Laterality Date  . UMBILICAL HERNIA REPAIR Bilateral 01/2005   Family History  Problem Relation Age of Onset  . Cancer Mother   . Celiac disease Mother   . Lung cancer Mother   . Other Mother        AV node Tachycardia  . Heart attack Paternal Grandfather   . Arthritis Maternal Grandmother   . High blood pressure Maternal Grandmother   . Sjogren's syndrome Maternal Grandmother   . Colon cancer Unknown        great grandmother  . High Cholesterol Father   . Syncope episode Father        micturition syncope  . Colon polyps Father   . Heart disease Maternal Grandfather   . Diabetes Maternal Grandfather   . High blood pressure Paternal Grandmother    Social History   Tobacco Use  . Smoking status: Never Smoker  . Smokeless tobacco: Never Used  Substance Use Topics  . Alcohol use: Yes    Alcohol/week: 0.0 oz    Comment: socially  . Drug use: No      Review of Systems Per HPI    Objective:   Physical Exam  Constitutional: She is oriented to person, place, and time. She appears well-developed and well-nourished. No distress.  HENT:  Head: Normocephalic and atraumatic.  Cardiovascular: Normal rate.  Pulmonary/Chest: Effort  normal.  Neurological: She is alert and oriented to person, place, and time.  Skin: She is not diaphoretic.  Psychiatric: She has a normal mood and affect. Her behavior is normal. Judgment and thought content normal.  Vitals reviewed.     BP 102/76   Pulse 75   Temp 97.7 F (36.5 C)   SpO2 97%  Results for orders placed or performed in visit on 05/18/18  POCT Urinalysis Dipstick (Automated)  Result Value Ref Range   Color, UA     Clarity, UA     Glucose, UA Negative Negative   Bilirubin, UA Negative    Ketones, UA Negative    Spec Grav, UA 1.010 1.010 - 1.025   Blood, UA Negative    pH, UA 6.0 5.0 - 8.0   Protein, UA Negative Negative   Urobilinogen, UA 0.2 0.2 or 1.0 E.U./dL   Nitrite, UA Negative    Leukocytes, UA Trace (A) Negative       Assessment & Plan:  1. Dysuria - POCT Urinalysis Dipstick (Automated)  2. Cystitis -Provided written and verbal information regarding diagnosis and treatment. - sulfamethoxazole-trimethoprim (BACTRIM DS,SEPTRA DS) 800-160 MG tablet; Take 1 tablet by mouth 2 (two) times daily.  Dispense: 6 tablet; Refill: 0 - Urine Culture   Olean Reeeborah Gessner, FNP-BC  [facility]  [place], [facility]  05/18/2018 2:40 PM

## 2018-05-18 NOTE — Patient Instructions (Signed)
Good to see you today I will touch base with you on Monday with your culture result I have sent in another round of antibiotic to your pharmacy

## 2018-05-18 NOTE — Telephone Encounter (Signed)
I replied to her message this morning, please call her about scheduling a follow up visit.

## 2018-05-18 NOTE — Telephone Encounter (Signed)
I spoke with patient & she called office this morning & scheduled appointment.

## 2018-05-20 LAB — URINE CULTURE
MICRO NUMBER:: 90716121
SPECIMEN QUALITY: ADEQUATE

## 2018-05-21 MED ORDER — AMOXICILLIN 500 MG PO CAPS: 500.0000 mg | ORAL_CAPSULE | Freq: Three times a day (TID) | ORAL | 0 refills | Status: AC

## 2018-05-21 NOTE — Addendum Note (Signed)
Addended by: Olean ReeGESSNER, DEBORAH B on: 05/21/2018 05:29 PM   Modules accepted: Orders

## 2018-06-05 ENCOUNTER — Other Ambulatory Visit: Payer: BC Managed Care – PPO

## 2018-06-05 ENCOUNTER — Encounter: Payer: Self-pay | Admitting: Family Medicine

## 2018-06-05 ENCOUNTER — Ambulatory Visit: Payer: BC Managed Care – PPO | Admitting: Family Medicine

## 2018-06-05 VITALS — BP 118/80 | HR 85 | Temp 97.7°F | Ht 68.0 in | Wt 212.5 lb

## 2018-06-05 DIAGNOSIS — Z23 Encounter for immunization: Secondary | ICD-10-CM

## 2018-06-05 DIAGNOSIS — Z7189 Other specified counseling: Secondary | ICD-10-CM

## 2018-06-05 DIAGNOSIS — N3090 Cystitis, unspecified without hematuria: Secondary | ICD-10-CM

## 2018-06-05 DIAGNOSIS — R300 Dysuria: Secondary | ICD-10-CM | POA: Diagnosis not present

## 2018-06-05 DIAGNOSIS — Z7184 Encounter for health counseling related to travel: Secondary | ICD-10-CM

## 2018-06-05 LAB — POC URINALSYSI DIPSTICK (AUTOMATED)
Bilirubin, UA: NEGATIVE
Blood, UA: NEGATIVE
Glucose, UA: NEGATIVE
Ketones, UA: NEGATIVE
LEUKOCYTES UA: NEGATIVE
NITRITE UA: NEGATIVE
PROTEIN UA: NEGATIVE
Spec Grav, UA: 1.015 (ref 1.010–1.025)
UROBILINOGEN UA: 0.2 U/dL
pH, UA: 7.5 (ref 5.0–8.0)

## 2018-06-05 MED ORDER — AZITHROMYCIN 500 MG PO TABS: 500.0000 mg | ORAL_TABLET | Freq: Every day | ORAL | 0 refills | Status: DC | PRN

## 2018-06-05 NOTE — Patient Instructions (Signed)
1st dose of HAV today.   2nd dose in 6 months.   Okay to try pepto or similar up front to prevent GI troubles.  Abx if needed for traveller's diarrhea.  Take care.  Glad to see you.

## 2018-06-05 NOTE — Progress Notes (Signed)
Going to [place] and meeting a friend who lives there.  Will be in country for 1 week.  Travelling tomorrow.  She shouldn't needed malaria proph based on her travel location.  Tdap up to date.  She doesn't have time to get typhoid vaccine prior to the trip.  She had HBV vaccine series in middle school, per patient report.  She does not have known history of hepatitis A vaccination.  She gave urine sample, u/a neg, ucx pending.  She has no FCNAVD or urinary sx now.    Meds, vitals, and allergies reviewed.   ROS: Per HPI unless specifically indicated in ROS section   GEN: nad, alert and oriented NECK: supple w/o LA CV: rrr. PULM: ctab, no inc wob ABD: soft, +bs EXT: no edema SKIN: no acute rash

## 2018-06-06 DIAGNOSIS — Z7184 Encounter for health counseling related to travel: Secondary | ICD-10-CM | POA: Insufficient documentation

## 2018-06-06 DIAGNOSIS — R300 Dysuria: Secondary | ICD-10-CM | POA: Insufficient documentation

## 2018-06-06 DIAGNOSIS — Z23 Encounter for immunization: Secondary | ICD-10-CM | POA: Insufficient documentation

## 2018-06-06 LAB — URINE CULTURE
MICRO NUMBER:: 90787384
Result:: NO GROWTH
SPECIMEN QUALITY: ADEQUATE

## 2018-06-06 NOTE — Assessment & Plan Note (Signed)
I use the CDC website for the [place] and we talked about relevant information.  Routine travel cautions discussed.  We talked about limiting risk for traveler's diarrhea with routine precautions.  She can try preemptive Pepto-Bismol.  Prescription given for Zithromax in case she has traveler's diarrhea.  Reasonable to get first dose of hepatitis A vaccine.  She would not have time to complete the oral typhoid vaccine prior to travel.  Discussed.  Other vaccines up-to-date.  Return for second dose of hepatitis A vaccine in 6 months.  She will be traveling with a friend will meet her at the airport in country and I wish her safe travels.

## 2018-06-06 NOTE — Assessment & Plan Note (Signed)
History of, with follow-up urine culture ordered pending per other provider.  I will defer.  No urinary symptoms at this point.

## 2018-11-10 ENCOUNTER — Telehealth: Payer: Self-pay | Admitting: Family Medicine

## 2018-11-10 DIAGNOSIS — Z0000 Encounter for general adult medical examination without abnormal findings: Secondary | ICD-10-CM

## 2018-11-10 NOTE — Telephone Encounter (Signed)
-----   Message from Wendi MayaLauren Greeson, RT sent at 11/09/2018  3:15 PM EST ----- Regarding: Lab orders for Monday Dec 9th Please enter CPE lab orders for 11/12/18. Thanks!

## 2018-11-12 ENCOUNTER — Other Ambulatory Visit (INDEPENDENT_AMBULATORY_CARE_PROVIDER_SITE_OTHER): Payer: 59

## 2018-11-12 DIAGNOSIS — Z0000 Encounter for general adult medical examination without abnormal findings: Secondary | ICD-10-CM

## 2018-11-12 LAB — COMPREHENSIVE METABOLIC PANEL
ALBUMIN: 4 g/dL (ref 3.5–5.2)
ALT: 12 U/L (ref 0–35)
AST: 16 U/L (ref 0–37)
Alkaline Phosphatase: 57 U/L (ref 39–117)
BILIRUBIN TOTAL: 0.6 mg/dL (ref 0.2–1.2)
BUN: 10 mg/dL (ref 6–23)
CALCIUM: 8.7 mg/dL (ref 8.4–10.5)
CO2: 25 mEq/L (ref 19–32)
CREATININE: 0.7 mg/dL (ref 0.40–1.20)
Chloride: 105 mEq/L (ref 96–112)
GFR: 103.35 mL/min (ref >60.00)
Glucose, Bld: 97 mg/dL (ref 70–99)
Potassium: 3.8 mEq/L (ref 3.5–5.1)
Sodium: 138 mEq/L (ref 135–145)
Total Protein: 6.6 g/dL (ref 6.0–8.3)

## 2018-11-12 LAB — CBC WITH DIFFERENTIAL/PLATELET
BASOS ABS: 0 10*3/uL (ref 0.0–0.1)
Basophils Relative: 0.5 % (ref 0.0–3.0)
EOS ABS: 0.2 10*3/uL (ref 0.0–0.7)
Eosinophils Relative: 4.6 % (ref 0.0–5.0)
HEMATOCRIT: 43.2 % (ref 36.0–46.0)
HEMOGLOBIN: 14.8 g/dL (ref 12.0–15.0)
LYMPHS PCT: 38 % (ref 12.0–46.0)
Lymphs Abs: 1.7 10*3/uL (ref 0.7–4.0)
MCHC: 34.3 g/dL (ref 30.0–36.0)
MCV: 92.3 fl (ref 78.0–100.0)
MONOS PCT: 7.8 % (ref 3.0–12.0)
Monocytes Absolute: 0.3 10*3/uL (ref 0.1–1.0)
Neutro Abs: 2.1 10*3/uL (ref 1.4–7.7)
Neutrophils Relative %: 49.1 % (ref 43.0–77.0)
Platelets: 249 10*3/uL (ref 150.0–400.0)
RBC: 4.69 Mil/uL (ref 3.87–5.11)
RDW: 12.6 % (ref 11.5–15.5)
WBC: 4.4 10*3/uL (ref 4.0–10.5)

## 2018-11-12 LAB — LIPID PANEL
CHOLESTEROL: 167 mg/dL (ref 0–200)
HDL: 64.4 mg/dL (ref >39.00)
LDL Cholesterol: 90 mg/dL (ref 0–99)
NonHDL: 102.75
TRIGLYCERIDES: 65 mg/dL (ref 0.0–149.0)
Total CHOL/HDL Ratio: 3
VLDL: 13 mg/dL (ref 0.0–40.0)

## 2018-11-12 LAB — TSH: TSH: 3.35 u[IU]/mL (ref 0.35–4.50)

## 2018-11-14 ENCOUNTER — Encounter: Payer: Self-pay | Admitting: Family Medicine

## 2018-11-14 ENCOUNTER — Ambulatory Visit (INDEPENDENT_AMBULATORY_CARE_PROVIDER_SITE_OTHER): Payer: 59 | Admitting: Family Medicine

## 2018-11-14 VITALS — BP 114/76 | HR 81 | Temp 98.6°F | Ht 68.25 in | Wt 214.2 lb

## 2018-11-14 DIAGNOSIS — Z0000 Encounter for general adult medical examination without abnormal findings: Secondary | ICD-10-CM

## 2018-11-14 DIAGNOSIS — G8929 Other chronic pain: Secondary | ICD-10-CM | POA: Diagnosis not present

## 2018-11-14 DIAGNOSIS — M25561 Pain in right knee: Secondary | ICD-10-CM | POA: Diagnosis not present

## 2018-11-14 NOTE — Assessment & Plan Note (Signed)
Ongoing knee pain (anterior) Did see sport med Recommend bike or swim  Wt loss may help

## 2018-11-14 NOTE — Progress Notes (Signed)
Subjective:    Patient ID: Tracey Garcia, female    DOB: 09-24-1986, 32 y.o.   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  Going to the beach at the end of the year  Taking fair care of herself /time is limited   Wt Readings from Last 3 Encounters:  11/14/18 214 lb 4 oz (97.2 kg)  06/05/18 212 lb 8 oz (96.4 kg)  05/18/18 214 lb (97.1 kg)  needs to get a better routine  Exercise- could do better /very little (thinking about getting a stationary bike)  Diet - eating ok /not as good (eats out too much)  32.34 kg/m   Still has a lot of knee pain - Dr Patsy Lager suggested biking and swimming   Flu vaccine - declines (does not get get them)  Family has had bad reactions to flu shots    Pap (thinks feb 2019) neg gyn/ Dr Vincente Poli  Self breast exams - no lumps    Father had abnormal colon polyp- was told family members need colonoscopy at 44 GGM with colon cancer     Tetanus shot 1/16 (Tdap)  BP Readings from Last 3 Encounters:  11/14/18 114/76  06/05/18 118/80  05/18/18 102/76   Pulse Readings from Last 3 Encounters:  11/14/18 81  06/05/18 85  05/18/18 75    Cholesterol Lab Results  Component Value Date   CHOL 167 11/12/2018   CHOL 179 08/17/2017   CHOL 187 10/21/2014   Lab Results  Component Value Date   HDL 64.40 11/12/2018   HDL 89.80 08/17/2017   HDL 79.10 10/21/2014   Lab Results  Component Value Date   LDLCALC 90 11/12/2018   LDLCALC 78 08/17/2017   LDLCALC 99 10/21/2014   Lab Results  Component Value Date   TRIG 65.0 11/12/2018   TRIG 57.0 08/17/2017   TRIG 43.0 10/21/2014   Lab Results  Component Value Date   CHOLHDL 3 11/12/2018   CHOLHDL 2 08/17/2017   CHOLHDL 2 10/21/2014   No results found for: LDLDIRECT LDL up a bit - not eating as well  HDL down-not exercising  Needs to get a better routine    Other labs: Results for orders placed or performed in visit on 11/12/18  TSH  Result Value Ref Range   TSH 3.35 0.35 - 4.50 uIU/mL  Lipid panel  Result Value Ref Range   Cholesterol 167 0 - 200 mg/dL   Triglycerides 40.9 0.0 - 149.0 mg/dL   HDL 81.19 >14.78 mg/dL   VLDL 29.5 0.0 - 62.1 mg/dL   LDL Cholesterol 90 0 - 99 mg/dL   Total CHOL/HDL Ratio 3    NonHDL 102.75   Comprehensive metabolic panel  Result Value Ref Range   Sodium 138 135 - 145 mEq/L   Potassium 3.8 3.5 - 5.1 mEq/L   Chloride 105 96 - 112 mEq/L   CO2 25 19 - 32 mEq/L   Glucose, Bld 97 70 - 99 mg/dL   BUN 10 6 - 23 mg/dL   Creatinine, Ser 3.08 0.40 - 1.20 mg/dL   Total Bilirubin 0.6 0.2 - 1.2 mg/dL   Alkaline Phosphatase 57 39 - 117 U/L   AST 16 0 - 37 U/L   ALT 12 0 - 35 U/L   Total Protein 6.6 6.0 - 8.3 g/dL   Albumin  4.0 3.5 - 5.2 g/dL   Calcium 8.7 8.4 - 16.1 mg/dL   GFR 096.04 >54.09 mL/min  CBC with Differential/Platelet  Result Value Ref Range   WBC 4.4 4.0 - 10.5 K/uL   RBC 4.69 3.87 - 5.11 Mil/uL   Hemoglobin 14.8 12.0 - 15.0 g/dL   HCT 81.1 91.4 - 78.2 %   MCV 92.3 78.0 - 100.0 fl   MCHC 34.3 30.0 - 36.0 g/dL   RDW 95.6 21.3 - 08.6 %   Platelets 249.0 150.0 - 400.0 K/uL   Neutrophils Relative % 49.1 43.0 - 77.0 %   Lymphocytes Relative 38.0 12.0 - 46.0 %   Monocytes Relative 7.8 3.0 - 12.0 %   Eosinophils Relative 4.6 0.0 - 5.0 %   Basophils Relative 0.5 0.0 - 3.0 %   Neutro Abs 2.1 1.4 - 7.7 K/uL   Lymphs Abs 1.7 0.7 - 4.0 K/uL   Monocytes Absolute 0.3 0.1 - 1.0 K/uL   Eosinophils Absolute 0.2 0.0 - 0.7 K/uL   Basophils Absolute 0.0 0.0 - 0.1 K/uL    , Patient Active Problem List   Diagnosis Date Noted  . Dysuria 06/06/2018  . Need for hepatitis A immunization 06/06/2018  . Travel advice encounter 06/06/2018  . Right knee pain 08/30/2017  . Joint pain of ankle and foot 08/30/2017  . Rash and nonspecific skin eruption 08/23/2017  . Multiple joint pain 08/23/2017  . Family  history of colonic polyps 08/23/2017  . Screening-pulmonary TB 08/12/2016  . Pre-employment examination 08/12/2016  . Recurrent sinusitis 10/23/2015  . Routine general medical examination at a health care facility 10/20/2014  . IBS (irritable bowel syndrome) 11/04/2013  . Generalized anxiety disorder 11/04/2013   Past Medical History:  Diagnosis Date  . Anxiety   . Anxiety   . History of IBS   . History of seasonal allergies    Past Surgical History:  Procedure Laterality Date  . UMBILICAL HERNIA REPAIR Bilateral 01/2005   Social History   Tobacco Use  . Smoking status: Never Smoker  . Smokeless tobacco: Never Used  Substance Use Topics  . Alcohol use: Yes    Alcohol/week: 0.0 standard drinks    Comment: socially  . Drug use: No   Family History  Problem Relation Age of Onset  . Cancer Mother   . Celiac disease Mother   . Lung cancer Mother   . Other Mother        AV node Tachycardia  . Heart attack Paternal Grandfather   . Arthritis Maternal Grandmother   . High blood pressure Maternal Grandmother   . Sjogren's syndrome Maternal Grandmother   . Colon cancer Unknown        great grandmother  . High Cholesterol Father   . Syncope episode Father        micturition syncope  . Colon polyps Father   . Heart disease Maternal Grandfather   . Diabetes Maternal Grandfather   . High blood pressure Paternal Grandmother    Allergies  Allergen Reactions  . Chlorine     Trouble breathing    No current outpatient medications on file prior to visit.   No current facility-administered medications on file prior to visit.      Review of Systems  Constitutional: Negative for activity change, appetite change, fatigue, fever and unexpected weight change.  HENT: Negative for congestion, ear pain, rhinorrhea, sinus pressure and sore throat.   Eyes: Negative for pain, redness and visual disturbance.  Respiratory: Negative for  cough, shortness of breath and wheezing.    Cardiovascular: Negative for chest pain and palpitations.  Gastrointestinal: Negative for abdominal pain, blood in stool, constipation and diarrhea.  Endocrine: Negative for polydipsia and polyuria.  Genitourinary: Negative for dysuria, frequency and urgency.  Musculoskeletal: Positive for arthralgias. Negative for back pain and myalgias.       Knee pain   Skin: Negative for pallor and rash.  Allergic/Immunologic: Negative for environmental allergies.  Neurological: Negative for dizziness, syncope and headaches.  Hematological: Negative for adenopathy. Does not bruise/bleed easily.  Psychiatric/Behavioral: Negative for decreased concentration and dysphoric mood. The patient is not nervous/anxious.        Objective:   Physical Exam  Constitutional: She appears well-developed and well-nourished. No distress.  obese and well appearing   HENT:  Head: Normocephalic and atraumatic.  Right Ear: External ear normal.  Left Ear: External ear normal.  Nose: Nose normal.  Mouth/Throat: Oropharynx is clear and moist.  Eyes: Pupils are equal, round, and reactive to light. Conjunctivae and EOM are normal. Right eye exhibits no discharge. Left eye exhibits no discharge. No scleral icterus.  Neck: Normal range of motion. Neck supple. No JVD present. Carotid bruit is not present. No thyromegaly present.  Cardiovascular: Normal rate, regular rhythm, normal heart sounds and intact distal pulses. Exam reveals no gallop.  Pulmonary/Chest: Effort normal and breath sounds normal. No stridor. No respiratory distress. She has no wheezes. She has no rales.  Abdominal: Soft. Bowel sounds are normal. She exhibits no distension and no mass. There is no tenderness.  Genitourinary:  Genitourinary Comments: Breast and gyn exam done by gyn provider  Musculoskeletal: She exhibits no edema or tenderness.  Lymphadenopathy:    She has no cervical adenopathy.  Neurological: She is alert. She has normal reflexes. She  displays normal reflexes. No cranial nerve deficit. She exhibits normal muscle tone. Coordination normal.  Skin: Skin is warm and dry. No rash noted. No erythema. No pallor.  Few lentigines and brown nevi  Psychiatric: She has a normal mood and affect.  Pleasant           Assessment & Plan:   Problem List Items Addressed This Visit      Other   Routine general medical examination at a health care facility - Primary    Reviewed health habits including diet and exercise and skin cancer prevention Reviewed appropriate screening tests for age  Also reviewed health mt list, fam hx and immunization status , as well as social and family history   Labs reviewed  Disc low impact exercise -bike/swim Diet with carbs from produce and less trans and sat fats for cholesterol  Sent for last pap report  Enc flu shot (she declined)        Right knee pain    Ongoing knee pain (anterior) Did see sport med Recommend bike or swim  Wt loss may help

## 2018-11-14 NOTE — Assessment & Plan Note (Signed)
Reviewed health habits including diet and exercise and skin cancer prevention Reviewed appropriate screening tests for age  Also reviewed health mt list, fam hx and immunization status , as well as social and family history   Labs reviewed  Disc low impact exercise -bike/swim Diet with carbs from produce and less trans and sat fats for cholesterol  Sent for last pap report  Enc flu shot (she declined)

## 2018-11-14 NOTE — Patient Instructions (Addendum)
We will send for pap report at check out   For cholesterol: Avoid red meat/ fried foods/ egg yolks/ fatty breakfast meats/ butter, cheese and high fat dairy/ and shellfish    Take care of yourself  Sit down with schedule weekly and work on exercise and diet   Glucosamine/chondroiton may help knee pain ( if it does not help do not spend more money on it )  Start biking    Less sugar may help joint pain  Try to get most of your carbohydrates from produce (with the exception of white potatoes)  Eat less bread/pasta/rice/snack foods/cereals/sweets and other items from the middle of the grocery store (processed carbs)

## 2018-12-24 ENCOUNTER — Ambulatory Visit: Payer: 59 | Admitting: Internal Medicine

## 2018-12-24 ENCOUNTER — Encounter: Payer: Self-pay | Admitting: Internal Medicine

## 2018-12-24 VITALS — BP 120/78 | HR 104 | Temp 98.0°F | Wt 217.0 lb

## 2018-12-24 DIAGNOSIS — J029 Acute pharyngitis, unspecified: Secondary | ICD-10-CM

## 2018-12-24 DIAGNOSIS — R0981 Nasal congestion: Secondary | ICD-10-CM | POA: Diagnosis not present

## 2018-12-24 DIAGNOSIS — R110 Nausea: Secondary | ICD-10-CM

## 2018-12-24 DIAGNOSIS — R05 Cough: Secondary | ICD-10-CM

## 2018-12-24 DIAGNOSIS — A084 Viral intestinal infection, unspecified: Secondary | ICD-10-CM

## 2018-12-24 DIAGNOSIS — R059 Cough, unspecified: Secondary | ICD-10-CM

## 2018-12-24 LAB — POC INFLUENZA A&B (BINAX/QUICKVUE)
Influenza A, POC: NEGATIVE
Influenza B, POC: NEGATIVE

## 2018-12-24 MED ORDER — HYDROCODONE-HOMATROPINE 5-1.5 MG/5ML PO SYRP: 5.0000 mL | ORAL_SOLUTION | Freq: Three times a day (TID) | ORAL | 0 refills | Status: DC | PRN

## 2018-12-24 MED ORDER — METHYLPREDNISOLONE ACETATE 80 MG/ML IJ SUSP
80.0000 mg | Freq: Once | INTRAMUSCULAR | Status: AC
  Administered 2018-12-24: 80 mg via INTRAMUSCULAR

## 2018-12-24 NOTE — Progress Notes (Signed)
HPI  Pt presents to the clinic today with c/o nasal congestion, sore throat and cough. She reports this started 2 days ago. She is blowing clear/yellow mucous out of her nose. She does have some discomfort with swallowing. The cough is nonproductive. She denies runny nose, ear pain, or shortness of breath. She denies fever, chills or body aches. She reports associated nausea and diarrhea but denies vomiting. She has tried Mucinex, Zyrtec, Robitussin and a [employer] with minimal relief. She has a history of recurrent sinusitis. She has not had sick contacts that she is aware of. She did have URI symptoms 3 weeks ago, but that resolved completely.  Review of Systems      Past Medical History:  Diagnosis Date  . Anxiety   . Anxiety   . History of IBS   . History of seasonal allergies     Family History  Problem Relation Age of Onset  . Cancer Mother   . Celiac disease Mother   . Lung cancer Mother   . Other Mother        AV node Tachycardia  . Heart attack Paternal Grandfather   . Arthritis Maternal Grandmother   . High blood pressure Maternal Grandmother   . Sjogren's syndrome Maternal Grandmother   . Colon cancer Unknown        great grandmother  . High Cholesterol Father   . Syncope episode Father        micturition syncope  . Colon polyps Father   . Heart disease Maternal Grandfather   . Diabetes Maternal Grandfather   . High blood pressure Paternal Grandmother     Social History   Socioeconomic History  . Marital status: Married    Spouse name: Not on file  . Number of children: Not on file  . Years of education: Not on file  . Highest education level: Not on file  Occupational History  . Not on file  Social Needs  . Financial resource strain: Not on file  . Food insecurity:    Worry: Not on file    Inability: Not on file  . Transportation needs:    Medical: Not on file    Non-medical: Not on file  Tobacco Use  . Smoking status: Never Smoker  . Smokeless  tobacco: Never Used  Substance and Sexual Activity  . Alcohol use: Yes    Alcohol/week: 0.0 standard drinks    Comment: socially  . Drug use: No  . Sexual activity: Not on file  Lifestyle  . Physical activity:    Days per week: Not on file    Minutes per session: Not on file  . Stress: Not on file  Relationships  . Social connections:    Talks on phone: Not on file    Gets together: Not on file    Attends religious service: Not on file    Active member of club or organization: Not on file    Attends meetings of clubs or organizations: Not on file    Relationship status: Not on file  . Intimate partner violence:    Fear of current or ex partner: Not on file    Emotionally abused: Not on file    Physically abused: Not on file    Forced sexual activity: Not on file  Other Topics Concern  . Not on file  Social History Narrative  . Not on file    Allergies  Allergen Reactions  . Chlorine     Trouble breathing  Constitutional:  Denies headache, fatigue, fever or  abrupt weight changes.  HEENT:  Positive nasal congestion, sore throat. Denies eye redness, eye pain, pressure behind the eyes, facial pain, ear pain, ringing in the ears, wax buildup, runny nose or bloody nose. Respiratory: Positive cough. Denies difficulty breathing or shortness of breath.  Cardiovascular: Denies chest pain, chest tightness, palpitations or swelling in the hands or feet.  Gastrointestinal: Pt reports nausea and diarrhea. Denies abdominal pain, vomiting, constipation or blood in the stool.   No other specific complaints in a complete review of systems (except as listed in HPI above).  Objective:   BP 120/78   Pulse (!) 104   Temp 98 F (36.7 C) (Oral)   Wt 217 lb (98.4 kg)   SpO2 98%   BMI 32.75 kg/m   Wt Readings from Last 3 Encounters:  12/24/18 217 lb (98.4 kg)  11/14/18 214 lb 4 oz (97.2 kg)  06/05/18 212 lb 8 oz (96.4 kg)     General: Appears her stated age, ill appearing,  in NAD. HEENT: Head: normal shape and size, no sinus tenderness noted;  Ears: Tm's gray and intact, normal light reflex; Nose: mucosa boggy and moist, septum midline; Throat/Mouth: + PND. Teeth present, mucosa pink and moist, no exudate noted, no lesions or ulcerations noted.  Neck: Bilateral anterior cervical lymphadenopathy.  Cardiovascular: Tachycardic with normal rhythm. S1,S2 noted.  No murmur, rubs or gallops noted.  Pulmonary/Chest: Normal effort and positive vesicular breath sounds. No respiratory distress. No wheezes, rales or ronchi noted.  Abdomen: Soft, generally tender. Active bowel sound. No distention or masses noted.      Assessment & Plan:   Nasal Congestion, Sore Throat, Cough, Nausea, Diarrhea:  Rapid flu: negative Get some rest and drink plenty of water Do salt water gargles for the sore throat 80 mg Depo IM today Continue Zyrtec, add Flonase OTC RX for Hycodan for cough  RTC as needed or if symptoms persist.   Nicki Reaper, NP

## 2018-12-24 NOTE — Addendum Note (Signed)
Addended by: Roena Malady on: 12/24/2018 12:54 PM   Modules accepted: Orders

## 2018-12-24 NOTE — Patient Instructions (Signed)
Viral Illness, Adult °Viruses are tiny germs that can get into a person's body and cause illness. There are many different types of viruses, and they cause many types of illness. Viral illnesses can range from mild to severe. They can affect various parts of the body. °Common illnesses that are caused by a virus include colds and the flu. Viral illnesses also include serious conditions such as HIV/AIDS (human immunodeficiency virus/acquired immunodeficiency syndrome). A few viruses have been linked to certain cancers. °What are the causes? °Many types of viruses can cause illness. Viruses invade cells in your body, multiply, and cause the infected cells to malfunction or die. When the cell dies, it releases more of the virus. When this happens, you develop symptoms of the illness, and the virus continues to spread to other cells. If the virus takes over the function of the cell, it can cause the cell to divide and grow out of control, as is the case when a virus causes cancer. °Different viruses get into the body in different ways. You can get a virus by: °· Swallowing food or water that is contaminated with the virus. °· Breathing in droplets that have been coughed or sneezed into the air by an infected person. °· Touching a surface that has been contaminated with the virus and then touching your eyes, nose, or mouth. °· Being bitten by an insect or animal that carries the virus. °· Having sexual contact with a person who is infected with the virus. °· Being exposed to blood or fluids that contain the virus, either through an open cut or during a transfusion. °If a virus enters your body, your body's defense system (immune system) will try to fight the virus. You may be at higher risk for a viral illness if your immune system is weak. °What are the signs or symptoms? °Symptoms vary depending on the type of virus and the location of the cells that it invades. Common symptoms of the main types of viral illnesses  include: °Cold and flu viruses °· Fever. °· Headache. °· Sore throat. °· Muscle aches. °· Nasal congestion. °· Cough. °Digestive system (gastrointestinal) viruses °· Fever. °· Abdominal pain. °· Nausea. °· Diarrhea. °Liver viruses (hepatitis) °· Loss of appetite. °· Tiredness. °· Yellowing of the skin (jaundice). °Brain and spinal cord viruses °· Fever. °· Headache. °· Stiff neck. °· Nausea and vomiting. °· Confusion or sleepiness. °Skin viruses °· Warts. °· Itching. °· Rash. °Sexually transmitted viruses °· Discharge. °· Swelling. °· Redness. °· Rash. °How is this treated? °Viruses can be difficult to treat because they live within cells. Antibiotic medicines do not treat viruses because these drugs do not get inside cells. Treatment for a viral illness may include: °· Resting and drinking plenty of fluids. °· Medicines to relieve symptoms. These can include over-the-counter medicine for pain and fever, medicines for cough or congestion, and medicines to relieve diarrhea. °· Antiviral medicines. These drugs are available only for certain types of viruses. They may help reduce flu symptoms if taken early. There are also many antiviral medicines for hepatitis and HIV/AIDS. °Some viral illnesses can be prevented with vaccinations. A common example is the flu shot. °Follow these instructions at home: °Medicines ° °· Take over-the-counter and prescription medicines only as told by your health care provider. °· If you were prescribed an antiviral medicine, take it as told by your health care provider. Do not stop taking the medicine even if you start to feel better. °· Be aware of when   antibiotics are needed and when they are not needed. Antibiotics do not treat viruses. If your health care provider thinks that you may have a bacterial infection as well as a viral infection, you may get an antibiotic. °? Do not ask for an antibiotic prescription if you have been diagnosed with a viral illness. That will not make your  illness go away faster. °? Frequently taking antibiotics when they are not needed can lead to antibiotic resistance. When this develops, the medicine no longer works against the bacteria that it normally fights. °General instructions °· Drink enough fluids to keep your urine clear or pale yellow. °· Rest as much as possible. °· Return to your normal activities as told by your health care provider. Ask your health care provider what activities are safe for you. °· Keep all follow-up visits as told by your health care provider. This is important. °How is this prevented? °Take these actions to reduce your risk of viral infection: °· Eat a healthy diet and get enough rest. °· Wash your hands often with soap and water. This is especially important when you are in public places. If soap and water are not available, use hand sanitizer. °· Avoid close contact with friends and family who have a viral illness. °· If you travel to areas where viral gastrointestinal infection is common, avoid drinking water or eating raw food. °· Keep your immunizations up to date. Get a flu shot every year as told by your health care provider. °· Do not share toothbrushes, nail clippers, razors, or needles with other people. °· Always practice safe sex. ° °Contact a health care provider if: °· You have symptoms of a viral illness that do not go away. °· Your symptoms come back after going away. °· Your symptoms get worse. °Get help right away if: °· You have trouble breathing. °· You have a severe headache or a stiff neck. °· You have severe vomiting or abdominal pain. °This information is not intended to replace advice given to you by your health care provider. Make sure you discuss any questions you have with your health care provider. °Document Released: 04/01/2016 Document Revised: 05/04/2016 Document Reviewed: 04/01/2016 °Elsevier Interactive Patient Education © 2019 [employer] ° °

## 2019-05-01 ENCOUNTER — Telehealth: Payer: 59 | Admitting: Family

## 2019-05-01 DIAGNOSIS — J019 Acute sinusitis, unspecified: Secondary | ICD-10-CM | POA: Diagnosis not present

## 2019-05-01 MED ORDER — AMOXICILLIN-POT CLAVULANATE 875-125 MG PO TABS: 1.0000 | ORAL_TABLET | Freq: Two times a day (BID) | ORAL | 0 refills | Status: DC

## 2019-05-01 NOTE — Progress Notes (Signed)
We are sorry that you are not feeling well.  Here is how we plan to help!  Approximately 5 minutes was spent documenting and reviewing patient's chart.    Based on what you have shared with me it looks like you have sinusitis.  Sinusitis is inflammation and infection in the sinus cavities of the head.  Based on your presentation I believe you most likely have Acute Bacterial Sinusitis.  This is an infection caused by bacteria and is treated with antibiotics. I have prescribed Augmentin 875mg/125mg one tablet twice daily with food, for 7 days. You may use an oral decongestant such as Mucinex D or if you have glaucoma or high blood pressure use plain Mucinex. Saline nasal spray help and can safely be used as often as needed for congestion.  If you develop worsening sinus pain, fever or notice severe headache and vision changes, or if symptoms are not better after completion of antibiotic, please schedule an appointment with a health care provider.    Sinus infections are not as easily transmitted as other respiratory infection, however we still recommend that you avoid close contact with loved ones, especially the very young and elderly.  Remember to wash your hands thoroughly throughout the day as this is the number one way to prevent the spread of infection!  Home Care:  Only take medications as instructed by your medical team.  Complete the entire course of an antibiotic.  Do not take these medications with alcohol.  A steam or ultrasonic humidifier can help congestion.  You can place a towel over your head and breathe in the steam from hot water coming from a faucet.  Avoid close contacts especially the very young and the elderly.  Cover your mouth when you cough or sneeze.  Always remember to wash your hands.  Get Help Right Away If:  You develop worsening fever or sinus pain.  You develop a severe head ache or visual changes.  Your symptoms persist after you have completed your  treatment plan.  Make sure you  Understand these instructions.  Will watch your condition.  Will get help right away if you are not doing well or get worse.  Your e-visit answers were reviewed by a board certified advanced clinical practitioner to complete your personal care plan.  Depending on the condition, your plan could have included both over the counter or prescription medications.  If there is a problem please reply  once you have received a response from your provider.  Your safety is important to us.  If you have drug allergies check your prescription carefully.    You can use MyChart to ask questions about today's visit, request a non-urgent call back, or ask for a work or school excuse for 24 hours related to this e-Visit. If it has been greater than 24 hours you will need to follow up with your provider, or enter a new e-Visit to address those concerns.  You will get an e-mail in the next two days asking about your experience.  I hope that your e-visit has been valuable and will speed your recovery. Thank you for using e-visits.    

## 2019-11-17 ENCOUNTER — Telehealth: Payer: Self-pay | Admitting: Family Medicine

## 2019-11-17 DIAGNOSIS — Z0000 Encounter for general adult medical examination without abnormal findings: Secondary | ICD-10-CM

## 2019-11-17 NOTE — Telephone Encounter (Signed)
-----   Message from [name] sent at 11/11/2019  3:03 PM EST ----- Regarding: Lab orders for Monday, 12.14.20 Patient is scheduled for CPX labs, please order future labs, Thanks , Karna Christmas

## 2019-11-18 ENCOUNTER — Other Ambulatory Visit: Payer: Self-pay

## 2019-11-18 ENCOUNTER — Other Ambulatory Visit (INDEPENDENT_AMBULATORY_CARE_PROVIDER_SITE_OTHER): Payer: 59

## 2019-11-18 DIAGNOSIS — Z0000 Encounter for general adult medical examination without abnormal findings: Secondary | ICD-10-CM | POA: Diagnosis not present

## 2019-11-18 LAB — CBC WITH DIFFERENTIAL/PLATELET
Basophils Absolute: 0 10*3/uL (ref 0.0–0.1)
Basophils Relative: 0.4 % (ref 0.0–3.0)
Eosinophils Absolute: 0.3 10*3/uL (ref 0.0–0.7)
Eosinophils Relative: 5.7 % — ABNORMAL HIGH (ref 0.0–5.0)
HCT: 45.4 % (ref 36.0–46.0)
Hemoglobin: 15.4 g/dL — ABNORMAL HIGH (ref 12.0–15.0)
Lymphocytes Relative: 35.1 % (ref 12.0–46.0)
Lymphs Abs: 1.8 10*3/uL (ref 0.7–4.0)
MCHC: 33.8 g/dL (ref 30.0–36.0)
MCV: 93.6 fl (ref 78.0–100.0)
Monocytes Absolute: 0.5 10*3/uL (ref 0.1–1.0)
Monocytes Relative: 8.7 % (ref 3.0–12.0)
Neutro Abs: 2.6 10*3/uL (ref 1.4–7.7)
Neutrophils Relative %: 50.1 % (ref 43.0–77.0)
Platelets: 257 10*3/uL (ref 150.0–400.0)
RBC: 4.85 Mil/uL (ref 3.87–5.11)
RDW: 12.9 % (ref 11.5–15.5)
WBC: 5.3 10*3/uL (ref 4.0–10.5)

## 2019-11-18 LAB — COMPREHENSIVE METABOLIC PANEL
ALT: 15 U/L (ref 0–35)
AST: 16 U/L (ref 0–37)
Albumin: 4.2 g/dL (ref 3.5–5.2)
Alkaline Phosphatase: 66 U/L (ref 39–117)
BUN: 9 mg/dL (ref 6–23)
CO2: 27 mEq/L (ref 19–32)
Calcium: 9.1 mg/dL (ref 8.4–10.5)
Chloride: 104 mEq/L (ref 96–112)
Creatinine, Ser: 0.72 mg/dL (ref 0.40–1.20)
GFR: 93.53 mL/min (ref >60.00)
Glucose, Bld: 89 mg/dL (ref 70–99)
Potassium: 4.4 mEq/L (ref 3.5–5.1)
Sodium: 138 mEq/L (ref 135–145)
Total Bilirubin: 0.5 mg/dL (ref 0.2–1.2)
Total Protein: 6.7 g/dL (ref 6.0–8.3)

## 2019-11-18 LAB — LIPID PANEL
Cholesterol: 204 mg/dL — ABNORMAL HIGH (ref 0–200)
HDL: 72.4 mg/dL (ref >39.00)
LDL Cholesterol: 120 mg/dL — ABNORMAL HIGH (ref 0–99)
NonHDL: 131.91
Total CHOL/HDL Ratio: 3
Triglycerides: 62 mg/dL (ref 0.0–149.0)
VLDL: 12.4 mg/dL (ref 0.0–40.0)

## 2019-11-18 LAB — TSH: TSH: 4.33 u[IU]/mL (ref 0.35–4.50)

## 2019-11-20 ENCOUNTER — Encounter: Payer: Self-pay | Admitting: Family Medicine

## 2019-11-20 ENCOUNTER — Ambulatory Visit (INDEPENDENT_AMBULATORY_CARE_PROVIDER_SITE_OTHER): Payer: 59 | Admitting: Family Medicine

## 2019-11-20 ENCOUNTER — Other Ambulatory Visit: Payer: Self-pay

## 2019-11-20 VITALS — BP 130/80 | HR 74 | Temp 97.4°F | Ht 68.0 in | Wt 220.4 lb

## 2019-11-20 DIAGNOSIS — Z23 Encounter for immunization: Secondary | ICD-10-CM | POA: Diagnosis not present

## 2019-11-20 DIAGNOSIS — Z0000 Encounter for general adult medical examination without abnormal findings: Secondary | ICD-10-CM

## 2019-11-20 NOTE — Progress Notes (Signed)
Subjective:    Patient ID: Tracey Garcia, female    DOB: [birth year], 33 y.o.   MRN: [id number]  This visit occurred during the SARS-CoV-2 public health emergency.  Safety protocols were in place, including screening questions prior to the visit, additional usage of staff PPE, and extensive cleaning of exam room while observing appropriate contact time as indicated for disinfecting solutions.    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)  Tolerating this    Wt Readings from Last 3 Encounters:  11/20/19 220 lb 6 oz (100 kg)  12/24/18 217 lb (98.4 kg)  11/14/18 214 lb 4 oz (97.2 kg)   33.51 kg/m   Feeling pretty good  Allergies are all year now Still using flonase in the bad times Zyrtec 10 mg  (she was taking it twice daily)     Flu vaccine -does not get  (family members have had severe allergic rxn)  Pap 5/19 neg at gyn ([facility])  Had another pap 6/20 -normal  Reviewed report  Has IUD-she likes it  This is her 2nd one  Her menses comes and goes a bit   May consider trying for pregnancy   Tdap 1/16   Needs her 2nd hep A shot today   Family h/o colon polyps (father with a high risk one) Has been adv to get first colonoscopy at age 54   Wt Readings from Last 3 Encounters:  11/20/19 220 lb 6 oz (100 kg)  12/24/18 217 lb (98.4 kg)  11/14/18 214 lb 4 oz (97.2 kg)   33.51 kg/m  Self care is a struggle   Exercise- has been getting out more -hiking and biking  Needs an option for home   Not eating as well as she should  She does not snack all day- thankfully  Breakfast is not great (cereal)  Too many carbs   Planning to start smoothies     Cholesterol Lab Results  Component Value Date   CHOL 204 (H) 11/18/2019   CHOL 167 11/12/2018   CHOL 179 08/17/2017   Lab Results  Component Value Date   HDL 72.40 11/18/2019   HDL 64.40  11/12/2018   HDL 89.80 08/17/2017   Lab Results  Component Value Date   LDLCALC 120 (H) 11/18/2019   [place] 90 11/12/2018   LDLCALC 78 08/17/2017   Lab Results  Component Value Date   TRIG 62.0 11/18/2019   TRIG 65.0 11/12/2018   TRIG 57.0 08/17/2017   Lab Results  Component Value Date   CHOLHDL 3 11/18/2019   CHOLHDL 3 11/12/2018   CHOLHDL 2 08/17/2017   No results found for: LDLDIRECT Eats a lot of red meat LDL did go up to 120  Father has high cholesterol      Other labs: Lab Results  Component Value Date   CREATININE 0.72 11/18/2019   BUN 9 11/18/2019   NA 138 11/18/2019   K 4.4 11/18/2019   CL 104 11/18/2019   CO2 27 11/18/2019   Lab Results  Component Value Date   ALT 15 11/18/2019   AST 16 11/18/2019   ALKPHOS 66 11/18/2019   BILITOT 0.5 11/18/2019   Glucose 89  Lab Results  Component Value Date   WBC 5.3 11/18/2019   HGB 15.4 (H) 11/18/2019   HCT 45.4 11/18/2019   MCV 93.6 11/18/2019  PLT 257.0 11/18/2019   Lab Results  Component Value Date   TSH 4.33 11/18/2019   Sees dermatology regularly  H/o abn moles None removed at last visit  Wears sun protection all the time   Patient Active Problem List   Diagnosis Date Noted  . Need for hepatitis A immunization 06/06/2018  . Travel advice encounter 06/06/2018  . Right knee pain 08/30/2017  . Family history of colonic polyps 08/23/2017  . Screening-pulmonary TB 08/12/2016  . Recurrent sinusitis 10/23/2015  . Routine general medical examination at a health care facility 10/20/2014  . IBS (irritable bowel syndrome) 11/04/2013  . Generalized anxiety disorder 11/04/2013   Past Medical History:  Diagnosis Date  . Anxiety   . Anxiety   . History of IBS   . History of seasonal allergies    Past Surgical History:  Procedure Laterality Date  . UMBILICAL HERNIA REPAIR Bilateral 01/2005   Social History   Tobacco Use  . Smoking status: Never Smoker  . Smokeless tobacco: Never Used    Substance Use Topics  . Alcohol use: Yes    Alcohol/week: 0.0 standard drinks    Comment: socially  . Drug use: No   Family History  Problem Relation Age of Onset  . Cancer Mother   . Celiac disease Mother   . Lung cancer Mother   . Other Mother        AV node Tachycardia  . Heart attack Paternal Grandfather   . Arthritis Maternal Grandmother   . High blood pressure Maternal Grandmother   . Sjogren's syndrome Maternal Grandmother   . Colon cancer Unknown        great grandmother  . High Cholesterol Father   . Syncope episode Father        micturition syncope  . Colon polyps Father   . Heart disease Maternal Grandfather   . Diabetes Maternal Grandfather   . High blood pressure Paternal Grandmother    Allergies  Allergen Reactions  . Chlorine     Trouble breathing    Current Outpatient Medications on File Prior to Visit  Medication Sig Dispense Refill  . cetirizine (ZYRTEC) 10 MG tablet Take 10 mg by mouth daily.    . fluticasone (FLONASE) 50 MCG/ACT nasal spray Place 2 sprays into both nostrils daily as needed for allergies or rhinitis.     No current facility-administered medications on file prior to visit.     Review of Systems  Constitutional: Negative for activity change, appetite change, fatigue, fever and unexpected weight change.  HENT: Positive for congestion. Negative for ear pain, rhinorrhea, sinus pressure and sore throat.        Worse allergic rhinitis this season  Eyes: Negative for pain, redness and visual disturbance.  Respiratory: Negative for cough, shortness of breath and wheezing.   Cardiovascular: Negative for chest pain and palpitations.  Gastrointestinal: Negative for abdominal pain, blood in stool, constipation and diarrhea.  Endocrine: Negative for polydipsia and polyuria.  Genitourinary: Negative for dysuria, frequency and urgency.  Musculoskeletal: Negative for arthralgias, back pain and myalgias.  Skin: Negative for pallor and rash.   Allergic/Immunologic: Negative for environmental allergies.  Neurological: Negative for dizziness, syncope and headaches.  Hematological: Negative for adenopathy. Does not bruise/bleed easily.  Psychiatric/Behavioral: Negative for decreased concentration and dysphoric mood. The patient is not nervous/anxious.        At times decreased motivation being at home during the pandemic       Objective:   Physical Exam Constitutional:  General: She is not in acute distress.    Appearance: Normal appearance. She is well-developed. She is obese. She is not ill-appearing or diaphoretic.     Comments: Large frame bmi in obese category but pt does not appear so   HENT:     Head: Normocephalic and atraumatic.     Right Ear: Tympanic membrane, ear canal and external ear normal.     Left Ear: Tympanic membrane, ear canal and external ear normal.     Nose: Nose normal. No congestion.     Mouth/Throat:     Mouth: Mucous membranes are moist.     Pharynx: Oropharynx is clear. No posterior oropharyngeal erythema.  Eyes:     General: No scleral icterus.    Extraocular Movements: Extraocular movements intact.     Conjunctiva/sclera: Conjunctivae normal.     Pupils: Pupils are equal, round, and reactive to light.  Neck:     Thyroid: No thyromegaly.     Vascular: No carotid bruit or JVD.  Cardiovascular:     Rate and Rhythm: Normal rate and regular rhythm.     Pulses: Normal pulses.     Heart sounds: Normal heart sounds. No gallop.   Pulmonary:     Effort: Pulmonary effort is normal. No respiratory distress.     Breath sounds: Normal breath sounds. No wheezing.     Comments: Good air exch Chest:     Chest wall: No tenderness.  Abdominal:     General: Bowel sounds are normal. There is no distension or abdominal bruit.     Palpations: Abdomen is soft. There is no mass.     Tenderness: There is no abdominal tenderness.     Hernia: No hernia is present.  Genitourinary:    Comments: Breast  exam: No mass, nodules, thickening, tenderness, bulging, retraction, inflamation, nipple discharge or skin changes noted.  No axillary or clavicular LA.     Musculoskeletal:        General: No tenderness. Normal range of motion.     Cervical back: Normal range of motion and neck supple. No rigidity. No muscular tenderness.     Right lower leg: No edema.     Left lower leg: No edema.  Lymphadenopathy:     Cervical: No cervical adenopathy.  Skin:    General: Skin is warm and dry.     Coloration: Skin is not pale.     Findings: No erythema or rash.     Comments: Many brown nevi of different sizes -trunk and back  Scattered scars from mole removals  Neurological:     Mental Status: She is alert. Mental status is at baseline.     Cranial Nerves: No cranial nerve deficit.     Motor: No abnormal muscle tone.     Coordination: Coordination normal.     Gait: Gait normal.     Deep Tendon Reflexes: Reflexes are normal and symmetric.  Psychiatric:        Mood and Affect: Mood normal.        Cognition and Memory: Cognition and memory normal.           Assessment & Plan:   Problem List Items Addressed This Visit      Other   Routine general medical examination at a health care facility - Primary    Reviewed health habits including diet and exercise and skin cancer prevention Reviewed appropriate screening tests for age  Also reviewed health mt list, fam hx and immunization status ,  as well as social and family history   See HPI Labs reviewed  Disc plan to get cholesterol down  Disc plan for exercise at home 2nd Hep A vaccine given Declines flu vaccines  Will need first colonoscopy at 36 (rev letter from her GI doctor)  Enc self breast exams Sent for last pap from her gyn from June (per pt neg)       Relevant Orders   Hepatitis A vaccine adult IM (Completed)   Need for hepatitis A immunization    Given today #2      Relevant Orders   Hepatitis A vaccine adult IM (Completed)

## 2019-11-20 NOTE — Assessment & Plan Note (Signed)
Given today #2

## 2019-11-20 NOTE — Patient Instructions (Addendum)
Try increasing flonase to daily  A vaporizer may help congestion   For healthier eating/weight loss Try to get most of your carbohydrates from produce (with the exception of white potatoes)  Eat less bread/pasta/rice/snack foods/cereals/sweets and other items from the middle of the grocery store (processed carbs)   For cholesterol Avoid red meat/ fried foods/ egg yolks/ fatty breakfast meats/ butter, cheese and high fat dairy/ and shellfish    Brainstorm your exercise program for home   Hepatitis A booster today

## 2019-11-20 NOTE — Assessment & Plan Note (Signed)
Reviewed health habits including diet and exercise and skin cancer prevention Reviewed appropriate screening tests for age  Also reviewed health mt list, fam hx and immunization status , as well as social and family history   See HPI Labs reviewed  Disc plan to get cholesterol down  Disc plan for exercise at home 2nd Hep A vaccine given Declines flu vaccines  Will need first colonoscopy at 36 (rev letter from her GI doctor)  Enc self breast exams Sent for last pap from her gyn from June (per pt neg)

## 2020-11-11 ENCOUNTER — Telehealth: Payer: 59 | Admitting: Family

## 2020-11-11 DIAGNOSIS — L509 Urticaria, unspecified: Secondary | ICD-10-CM

## 2020-11-11 MED ORDER — TRIAMCINOLONE ACETONIDE 0.5 % EX OINT: 1.0000 "application " | TOPICAL_OINTMENT | Freq: Two times a day (BID) | CUTANEOUS | 0 refills | Status: DC

## 2020-11-11 NOTE — Progress Notes (Signed)
E Visit for Rash  We are sorry that you are not feeling well. Here is how we plan to help   Based on what you shared with me you may have  an allergic reaction. I recommend you take Benadryl 25 mg - 50 mg every 4 hours to control the symptoms but if they last over 24 hours it is best that you see an office based provider for follow up.  I have sent in Kenalog cream that you can use twice a day.     HOME CARE:   Take cool showers and avoid direct sunlight.  Apply cool compress or wet dressings.  Take a bath in an oatmeal bath.  Sprinkle content of one Aveeno packet under running faucet with comfortably warm water.  Bathe for 15-20 minutes, 1-2 times daily.  Pat dry with a towel. Do not rub the rash.  Use hydrocortisone cream.  Take an antihistamine like Benadryl for widespread rashes that itch.  The adult dose of Benadryl is 25-50 mg by mouth 4 times daily.  Caution:  This type of medication may cause sleepiness.  Do not drink alcohol, drive, or operate dangerous machinery while taking antihistamines.  Do not take these medications if you have prostate enlargement.  Read package instructions thoroughly on all medications that you take.  GET HELP RIGHT AWAY IF:   Symptoms don't go away after treatment.  Severe itching that persists.  If you rash spreads or swells.  If you rash begins to smell.  If it blisters and opens or develops a yellow-brown crust.  You develop a fever.  You have a sore throat.  You become short of breath.  MAKE SURE YOU:  Understand these instructions. Will watch your condition. Will get help right away if you are not doing well or get worse.  Thank you for choosing an e-visit. Your e-visit answers were reviewed by a board certified advanced clinical practitioner to complete your personal care plan. Depending upon the condition, your plan could have included both over the counter or prescription medications. Please review your pharmacy choice.  Be sure that the pharmacy you have chosen is open so that you can pick up your prescription now.  If there is a problem you may message your provider in MyChart to have the prescription routed to another pharmacy. Your safety is important to [place]. If you have drug allergies check your prescription carefully.  For the next 24 hours, you can use MyChart to ask questions about today's visit, request a non-urgent call back, or ask for a work or school excuse from your e-visit provider. You will get an email in the next two days asking about your experience. I hope that your e-visit has been valuable and will speed your recovery.   Approximately 5 minutes was spent documenting and reviewing patient's chart.

## 2020-11-15 ENCOUNTER — Telehealth: Payer: Self-pay | Admitting: Family Medicine

## 2020-11-15 DIAGNOSIS — Z0000 Encounter for general adult medical examination without abnormal findings: Secondary | ICD-10-CM

## 2020-11-15 NOTE — Telephone Encounter (Signed)
-----   Message from Aquilla Solian, RT sent at 11/03/2020  1:59 PM EST ----- Regarding: Lab Orders for Monday 12.13.2021 Please place lab orders for Monday 12.13.2021, office visit for physical on Monday 12.20.2021 Thank you, Jones Bales RT(R)

## 2020-11-16 ENCOUNTER — Other Ambulatory Visit: Payer: Self-pay

## 2020-11-16 ENCOUNTER — Other Ambulatory Visit (INDEPENDENT_AMBULATORY_CARE_PROVIDER_SITE_OTHER): Payer: 59

## 2020-11-16 DIAGNOSIS — Z0000 Encounter for general adult medical examination without abnormal findings: Secondary | ICD-10-CM

## 2020-11-16 LAB — COMPREHENSIVE METABOLIC PANEL
ALT: 13 U/L (ref 0–35)
AST: 16 U/L (ref 0–37)
Albumin: 4.2 g/dL (ref 3.5–5.2)
Alkaline Phosphatase: 68 U/L (ref 39–117)
BUN: 10 mg/dL (ref 6–23)
CO2: 28 mEq/L (ref 19–32)
Calcium: 8.9 mg/dL (ref 8.4–10.5)
Chloride: 102 mEq/L (ref 96–112)
Creatinine, Ser: 0.79 mg/dL (ref 0.40–1.20)
GFR: 98.17 mL/min (ref >60.00)
Glucose, Bld: 86 mg/dL (ref 70–99)
Potassium: 4.2 mEq/L (ref 3.5–5.1)
Sodium: 136 mEq/L (ref 135–145)
Total Bilirubin: 0.6 mg/dL (ref 0.2–1.2)
Total Protein: 6.8 g/dL (ref 6.0–8.3)

## 2020-11-16 LAB — CBC WITH DIFFERENTIAL/PLATELET
Basophils Absolute: 0 10*3/uL (ref 0.0–0.1)
Basophils Relative: 0.5 % (ref 0.0–3.0)
Eosinophils Absolute: 0.2 10*3/uL (ref 0.0–0.7)
Eosinophils Relative: 4 % (ref 0.0–5.0)
HCT: 42.9 % (ref 36.0–46.0)
Hemoglobin: 14.7 g/dL (ref 12.0–15.0)
Lymphocytes Relative: 31.6 % (ref 12.0–46.0)
Lymphs Abs: 1.6 10*3/uL (ref 0.7–4.0)
MCHC: 34.3 g/dL (ref 30.0–36.0)
MCV: 91.3 fl (ref 78.0–100.0)
Monocytes Absolute: 0.5 10*3/uL (ref 0.1–1.0)
Monocytes Relative: 10.2 % (ref 3.0–12.0)
Neutro Abs: 2.6 10*3/uL (ref 1.4–7.7)
Neutrophils Relative %: 53.7 % (ref 43.0–77.0)
Platelets: 238 10*3/uL (ref 150.0–400.0)
RBC: 4.7 Mil/uL (ref 3.87–5.11)
RDW: 12.9 % (ref 11.5–15.5)
WBC: 4.9 10*3/uL (ref 4.0–10.5)

## 2020-11-16 LAB — LIPID PANEL
Cholesterol: 193 mg/dL (ref 0–200)
HDL: 71 mg/dL (ref >39.00)
LDL Cholesterol: 109 mg/dL — ABNORMAL HIGH (ref 0–99)
NonHDL: 121.92
Total CHOL/HDL Ratio: 3
Triglycerides: 65 mg/dL (ref 0.0–149.0)
VLDL: 13 mg/dL (ref 0.0–40.0)

## 2020-11-16 LAB — TSH: TSH: 3.22 u[IU]/mL (ref 0.35–4.50)

## 2020-11-18 ENCOUNTER — Ambulatory Visit: Payer: 59 | Admitting: Family Medicine

## 2020-11-18 ENCOUNTER — Other Ambulatory Visit: Payer: Self-pay

## 2020-11-18 ENCOUNTER — Encounter: Payer: Self-pay | Admitting: Family Medicine

## 2020-11-18 VITALS — BP 122/80 | HR 85 | Temp 97.4°F | Ht 68.0 in | Wt 227.3 lb

## 2020-11-18 DIAGNOSIS — R21 Rash and other nonspecific skin eruption: Secondary | ICD-10-CM

## 2020-11-18 DIAGNOSIS — H538 Other visual disturbances: Secondary | ICD-10-CM | POA: Diagnosis not present

## 2020-11-18 NOTE — Patient Instructions (Addendum)
Continue the triamcinolone ointment in rash areas as needed   Avoid harsh detergents/ scented products/ hot water   Make note of exposures re: timing of different rash   Moisturize skin as well (non scented creams)   Let's re evaluate at your upcoming appt  Make note of episodes of blurred vision -let me know

## 2020-11-18 NOTE — Assessment & Plan Note (Signed)
Several episodes corresponding to benadryl use for a rash  Nl oph w/u Nl exam today   Brought up question of MS or auto immune problems in light of family hx  Doubtful based on exam today but will monitor F/u next wk as planned

## 2020-11-18 NOTE — Progress Notes (Signed)
Subjective:    Patient ID: Tracey Garcia, female    DOB: 1986-06-12, 34 y.o.   MRN: [id number]  This visit occurred during the SARS-CoV-2 public health emergency.  Safety protocols were in place, including screening questions prior to the visit, additional usage of staff PPE, and extensive cleaning of exam room while observing appropriate contact time as indicated for disinfecting solutions.    HPI Pt presents with rash (arms/chest) and vision change  Wt Readings from Last 3 Encounters:  11/18/20 227 lb 5 oz (103.1 kg)  11/20/19 220 lb 6 oz (100 kg)  12/24/18 217 lb (98.4 kg)   34.56 kg/m  Had a rash- started last Sunday  ? Bug bites-then became more itchy  Then more rash (smaller spots) on her chest  Took a benadryl   Had e visit with pictures  Dx with possible hives Px kenalog ointment   Last Wednesday-noted headache/fatigue and some blurry vision (when reading)  Was on benadryl  Stopped benadryl and continued the ointment   On Sunday-started period  Blurry vision again in the car driving, another episode yesterday when reading   Went to eye doctor yesterday afternoon  Everything looked good with her eyes  ? If any auto immune issues / disc MS poss   Family: Aunt- MS Aunt- RA GM-sjogrens M- celiac Great aunt - ALS   No facial rash No joint swelling or pain   Right now -stopped the ointment  Takes zyrtec 10 mg daily   pepcid was recommended but she did not try it   Rash - better / almost gone  Had a stressful   Just had PE labs  Has a health mt exam scheduled next week   BP Readings from Last 3 Encounters:  11/18/20 122/80  11/20/19 130/80  12/24/18 120/78      Pulse Readings from Last 3 Encounters:  11/18/20 85  11/20/19 74  12/24/18 (!) 104   Patient Active Problem List   Diagnosis Date Noted  . Blurred vision 11/18/2020  . Need for hepatitis A immunization 06/06/2018  . Travel advice encounter 06/06/2018  . Right knee pain 08/30/2017   . Rash and nonspecific skin eruption 08/23/2017  . Family history of colonic polyps 08/23/2017  . Screening-pulmonary TB 08/12/2016  . Recurrent sinusitis 10/23/2015  . Routine general medical examination at a health care facility 10/20/2014  . IBS (irritable bowel syndrome) 11/04/2013  . Generalized anxiety disorder 11/04/2013   Past Medical History:  Diagnosis Date  . Anxiety   . Anxiety   . History of IBS   . History of seasonal allergies    Past Surgical History:  Procedure Laterality Date  . UMBILICAL HERNIA REPAIR Bilateral 01/2005   Social History   Tobacco Use  . Smoking status: Never Smoker  . Smokeless tobacco: Never Used  Substance Use Topics  . Alcohol use: Yes    Alcohol/week: 0.0 standard drinks    Comment: socially  . Drug use: No   Family History  Problem Relation Age of Onset  . Cancer Mother   . Celiac disease Mother   . Lung cancer Mother   . Other Mother        AV node Tachycardia  . Heart attack Paternal Grandfather   . Arthritis Maternal Grandmother   . High blood pressure Maternal Grandmother   . Sjogren's syndrome Maternal Grandmother   . Colon cancer Unknown        great grandmother  . High Cholesterol Father   .  Syncope episode Father        micturition syncope  . Colon polyps Father   . Heart disease Maternal Grandfather   . Diabetes Maternal Grandfather   . High blood pressure Paternal Grandmother    Allergies  Allergen Reactions  . Chlorine     Trouble breathing    Current Outpatient Medications on File Prior to Visit  Medication Sig Dispense Refill  . cetirizine (ZYRTEC) 10 MG tablet Take 10 mg by mouth daily.    . fluticasone (FLONASE) 50 MCG/ACT nasal spray Place 2 sprays into both nostrils daily as needed for allergies or rhinitis.    Marland Kitchen triamcinolone ointment (KENALOG) 0.5 % Apply 1 application topically 2 (two) times daily. (Patient not taking: Reported on 11/18/2020) 30 g 0   No current facility-administered  medications on file prior to visit.    Review of Systems  Constitutional: Negative for activity change, appetite change, fatigue, fever and unexpected weight change.  HENT: Negative for congestion, ear pain, rhinorrhea, sinus pressure and sore throat.   Eyes: Positive for visual disturbance. Negative for photophobia, pain, discharge, redness and itching.  Respiratory: Negative for cough, shortness of breath and wheezing.   Cardiovascular: Negative for chest pain and palpitations.  Gastrointestinal: Negative for abdominal pain, blood in stool, constipation and diarrhea.  Endocrine: Negative for polydipsia and polyuria.  Genitourinary: Negative for dysuria, frequency and urgency.  Musculoskeletal: Negative for arthralgias, back pain and myalgias.  Skin: Positive for rash. Negative for pallor and wound.  Allergic/Immunologic: Negative for environmental allergies.  Neurological: Negative for dizziness, syncope, facial asymmetry and headaches.  Hematological: Negative for adenopathy. Does not bruise/bleed easily.  Psychiatric/Behavioral: Negative for decreased concentration and dysphoric mood. The patient is not nervous/anxious.        Objective:   Physical Exam Constitutional:      General: She is not in acute distress.    Appearance: Normal appearance. She is obese. She is not ill-appearing or diaphoretic.  HENT:     Head: Normocephalic and atraumatic.     Mouth/Throat:     Pharynx: Oropharynx is clear.  Eyes:     General: No scleral icterus.       Right eye: No discharge.        Left eye: No discharge.     Extraocular Movements: Extraocular movements intact.     Conjunctiva/sclera: Conjunctivae normal.     Pupils: Pupils are equal, round, and reactive to light.  Cardiovascular:     Rate and Rhythm: Normal rate and regular rhythm.     Pulses: Normal pulses.     Heart sounds: Normal heart sounds.  Pulmonary:     Effort: Pulmonary effort is normal. No respiratory distress.      Breath sounds: Normal breath sounds. No wheezing.  Musculoskeletal:     Cervical back: Normal range of motion and neck supple.     Right lower leg: No edema.     Left lower leg: No edema.     Comments: No acute joint changes   Lymphadenopathy:     Cervical: No cervical adenopathy.  Skin:    General: Skin is warm and dry.     Comments: One 2 cm whelp under L arm (axillae are clear)   Few dried papules on mid chest  No pustules or vesicles   Neurological:     Mental Status: She is alert.     Cranial Nerves: No cranial nerve deficit.     Sensory: No sensory deficit.  Motor: No weakness.     Coordination: Coordination normal.     Gait: Gait normal.     Deep Tendon Reflexes: Reflexes normal.  Psychiatric:        Mood and Affect: Mood normal.           Assessment & Plan:   Problem List Items Addressed This Visit      Musculoskeletal and Integument   Rash and nonspecific skin eruption - Primary    Papular rash on chest-suspect contact dermatitis and almost gone Disc avoidance of fragrances and hot water and harsh detergents  Triamcinolone has helped  Whelps underneath arms (now also almost gone) Continue zyrtec  Note any new exposures Stay cool  Triamcinolone may help         Other   Blurred vision    Several episodes corresponding to benadryl use for a rash  Nl oph w/u Nl exam today   Brought up question of MS or auto immune problems in light of family hx  Doubtful based on exam today but will monitor F/u next wk as planned

## 2020-11-18 NOTE — Assessment & Plan Note (Signed)
Papular rash on chest-suspect contact dermatitis and almost gone Disc avoidance of fragrances and hot water and harsh detergents  Triamcinolone has helped  Whelps underneath arms (now also almost gone) Continue zyrtec  Note any new exposures Stay cool  Triamcinolone may help

## 2020-11-23 ENCOUNTER — Encounter: Payer: Self-pay | Admitting: Family Medicine

## 2020-11-23 ENCOUNTER — Encounter: Payer: 59 | Admitting: Family Medicine

## 2020-11-23 ENCOUNTER — Ambulatory Visit (INDEPENDENT_AMBULATORY_CARE_PROVIDER_SITE_OTHER): Payer: 59 | Admitting: Family Medicine

## 2020-11-23 ENCOUNTER — Other Ambulatory Visit: Payer: Self-pay

## 2020-11-23 ENCOUNTER — Other Ambulatory Visit: Payer: 59

## 2020-11-23 VITALS — BP 124/78 | HR 75 | Temp 97.1°F | Ht 68.25 in | Wt 222.1 lb

## 2020-11-23 DIAGNOSIS — Z0000 Encounter for general adult medical examination without abnormal findings: Secondary | ICD-10-CM

## 2020-11-23 DIAGNOSIS — R21 Rash and other nonspecific skin eruption: Secondary | ICD-10-CM | POA: Diagnosis not present

## 2020-11-23 DIAGNOSIS — H538 Other visual disturbances: Secondary | ICD-10-CM | POA: Diagnosis not present

## 2020-11-23 NOTE — Patient Instructions (Addendum)
Try to get most of your carbohydrates from produce (with the exception of white potatoes)  Eat less bread/pasta/rice/snack foods/cereals/sweets and other items from the middle of the grocery store (processed carbs)  For cholesterol Avoid red meat/ fried foods/ egg yolks/ fatty breakfast meats/ butter, cheese and high fat dairy/ and shellfish   Think about generic prozac (fluoxetine) for pre menstrual dysphoric disorder   It can be dosed for 7 days prior to period or just daily   An oral contraceptive like yaz or yasmin may also help

## 2020-11-23 NOTE — Assessment & Plan Note (Signed)
This has not recurred

## 2020-11-23 NOTE — Assessment & Plan Note (Addendum)
Reviewed health habits including diet and exercise and skin cancer prevention Reviewed appropriate screening tests for age  Also reviewed health mt list, fam hx and immunization status , as well as social and family history   See HPI Labs reviewed  Good exercise  utd gyn care  Discussed possible trial of OC for PMDD and mid month cramping  imms up to date incl covid  Plan first colonoscopy at 36 due to family hx of polyps  Cholesterol is improved

## 2020-11-23 NOTE — Progress Notes (Signed)
Subjective:    Patient ID: Tracey Garcia, female    DOB: 10/08/1986, 34 y.o.   MRN: [id number]  This visit occurred during the SARS-CoV-2 public health emergency.  Safety protocols were in place, including screening questions prior to the visit, additional usage of staff PPE, and extensive cleaning of exam room while observing appropriate contact time as indicated for disinfecting solutions.    HPI  Here for health maintenance exam and to review chronic medical problems    Wt Readings from Last 3 Encounters:  11/23/20 222 lb 2 oz (100.8 kg)  11/18/20 227 lb 5 oz (103.1 kg)  11/20/19 220 lb 6 oz (100 kg)   33.53 kg/m  Taking care of herself  Much better with exercise this year  Few programs and videos  She has a spin bike now -enjoys it  Also walking  Built pickle ball ct in back yard - loves it    [facility]  Had a visit in June -nothing new - does not think she had a pap Self breast exam - no lumps  Had IUD out- some hormonal symptoms /irritable  On her chin she has course hair also   Periods are regular  Not trying to get pregnant   (condoms/rhythm) --if it happens ok    Tdap 1/16 Flu shot 10/21 covid vaccinated with booster   Family hx -colon polyp high risk -father  She will need first colonoscopy at age 41  BP Readings from Last 3 Encounters:  11/23/20 124/78  11/18/20 122/80  11/20/19 130/80   Pulse Readings from Last 3 Encounters:  11/23/20 75  11/18/20 85  11/20/19 74   Cholesterol Lab Results  Component Value Date   CHOL 193 11/16/2020   CHOL 204 (H) 11/18/2019   CHOL 167 11/12/2018   Lab Results  Component Value Date   HDL 71.00 11/16/2020   HDL 72.40 11/18/2019   HDL 64.40 11/12/2018   Lab Results  Component Value Date   LDLCALC 109 (H) 11/16/2020   LDLCALC 120 (H) 11/18/2019   LDLCALC 90 11/12/2018   Lab Results  Component Value Date   TRIG 65.0 11/16/2020   TRIG 62.0 11/18/2019   TRIG 65.0 11/12/2018    Lab Results  Component Value Date   CHOLHDL 3 11/16/2020   CHOLHDL 3 11/18/2019   CHOLHDL 3 11/12/2018   No results found for: LDLDIRECT LDL is down  Trying to eat better - wants to continue changing diet  Does not eat out as much  Has to get away from cereal    Other labs/stable Results for orders placed or performed in visit on 11/16/20  TSH  Result Value Ref Range   TSH 3.22 0.35 - 4.50 uIU/mL  Lipid panel  Result Value Ref Range   Cholesterol 193 0 - 200 mg/dL   Triglycerides 49.8 0.0 - 149.0 mg/dL   HDL 26.41 >58.30 mg/dL   VLDL 94.0 0.0 - 76.8 mg/dL   LDL Cholesterol 088 (H) 0 - 99 mg/dL   Total CHOL/HDL Ratio 3    NonHDL 121.92   Comprehensive metabolic panel  Result Value Ref Range   Sodium 136 135 - 145 mEq/L   Potassium 4.2 3.5 - 5.1 mEq/L   Chloride 102 96 - 112 mEq/L   CO2 28 19 - 32 mEq/L   Glucose, Bld 86 70 - 99 mg/dL   BUN 10 6 - 23 mg/dL   Creatinine, Ser 1.10 0.40 - 1.20 mg/dL  Total Bilirubin 0.6 0.2 - 1.2 mg/dL   Alkaline Phosphatase 68 39 - 117 U/L   AST 16 0 - 37 U/L   ALT 13 0 - 35 U/L   Total Protein 6.8 6.0 - 8.3 g/dL   Albumin 4.2 3.5 - 5.2 g/dL   GFR 16.1098.17 >96.04>60.00 mL/min   Calcium 8.9 8.4 - 10.5 mg/dL  CBC with Differential/Platelet  Result Value Ref Range   WBC 4.9 4.0 - 10.5 K/uL   RBC 4.70 3.87 - 5.11 Mil/uL   Hemoglobin 14.7 12.0 - 15.0 g/dL   HCT 54.042.9 98.136.0 - 19.146.0 %   MCV 91.3 78.0 - 100.0 fl   MCHC 34.3 30.0 - 36.0 g/dL   RDW 47.812.9 29.511.5 - 62.115.5 %   Platelets 238.0 150.0 - 400.0 K/uL   Neutrophils Relative % 53.7 43.0 - 77.0 %   Lymphocytes Relative 31.6 12.0 - 46.0 %   Monocytes Relative 10.2 3.0 - 12.0 %   Eosinophils Relative 4.0 0.0 - 5.0 %   Basophils Relative 0.5 0.0 - 3.0 %   Neutro Abs 2.6 1.4 - 7.7 K/uL   Lymphs Abs 1.6 0.7 - 4.0 K/uL   Monocytes Absolute 0.5 0.1 - 1.0 K/uL   Eosinophils Absolute 0.2 0.0 - 0.7 K/uL   Basophils Absolute 0.0 0.0 - 0.1 K/uL    Patient Active Problem List   Diagnosis Date Noted   . Blurred vision 11/18/2020  . Need for hepatitis A immunization 06/06/2018  . Travel advice encounter 06/06/2018  . Right knee pain 08/30/2017  . Rash and nonspecific skin eruption 08/23/2017  . Family history of colonic polyps 08/23/2017  . Screening-pulmonary TB 08/12/2016  . Recurrent sinusitis 10/23/2015  . Routine general medical examination at a health care facility 10/20/2014  . IBS (irritable bowel syndrome) 11/04/2013  . Generalized anxiety disorder 11/04/2013   Past Medical History:  Diagnosis Date  . Anxiety   . Anxiety   . History of IBS   . History of seasonal allergies    Past Surgical History:  Procedure Laterality Date  . UMBILICAL HERNIA REPAIR Bilateral 01/2005   Social History   Tobacco Use  . Smoking status: Never Smoker  . Smokeless tobacco: Never Used  Substance Use Topics  . Alcohol use: Yes    Alcohol/week: 0.0 standard drinks    Comment: socially  . Drug use: No   Family History  Problem Relation Age of Onset  . Cancer Mother   . Celiac disease Mother   . Lung cancer Mother   . Other Mother        AV node Tachycardia  . Heart attack Paternal Grandfather   . Arthritis Maternal Grandmother   . High blood pressure Maternal Grandmother   . Sjogren's syndrome Maternal Grandmother   . Colon cancer Unknown        great grandmother  . High Cholesterol Father   . Syncope episode Father        micturition syncope  . Colon polyps Father   . Heart disease Maternal Grandfather   . Diabetes Maternal Grandfather   . High blood pressure Paternal Grandmother    Allergies  Allergen Reactions  . Chlorine     Trouble breathing    Current Outpatient Medications on File Prior to Visit  Medication Sig Dispense Refill  . cetirizine (ZYRTEC) 10 MG tablet Take 10 mg by mouth daily.    . fluticasone (FLONASE) 50 MCG/ACT nasal spray Place 2 sprays into both nostrils daily as needed  for allergies or rhinitis.    Marland Kitchen triamcinolone ointment (KENALOG) 0.5 %  Apply 1 application topically 2 (two) times daily. 30 g 0   No current facility-administered medications on file prior to visit.    Review of Systems  Constitutional: Negative for activity change, appetite change, fatigue, fever and unexpected weight change.  HENT: Negative for congestion, ear pain, rhinorrhea, sinus pressure and sore throat.   Eyes: Negative for pain, redness and visual disturbance.  Respiratory: Negative for cough, shortness of breath and wheezing.   Cardiovascular: Negative for chest pain and palpitations.  Gastrointestinal: Negative for abdominal pain, blood in stool, constipation and diarrhea.  Endocrine: Negative for polydipsia and polyuria.  Genitourinary: Negative for dysuria, frequency and urgency.  Musculoskeletal: Negative for arthralgias, back pain and myalgias.  Skin: Negative for pallor and rash.  Allergic/Immunologic: Negative for environmental allergies.  Neurological: Negative for dizziness, syncope and headaches.  Hematological: Negative for adenopathy. Does not bruise/bleed easily.  Psychiatric/Behavioral: Negative for decreased concentration and dysphoric mood. The patient is not nervous/anxious.        Objective:   Physical Exam Constitutional:      General: She is not in acute distress.    Appearance: Normal appearance. She is well-developed. She is obese. She is not ill-appearing or diaphoretic.  HENT:     Head: Normocephalic and atraumatic.     Right Ear: Tympanic membrane, ear canal and external ear normal.     Left Ear: Tympanic membrane, ear canal and external ear normal.     Nose: Nose normal. No congestion.     Mouth/Throat:     Mouth: Mucous membranes are moist.     Pharynx: Oropharynx is clear. No posterior oropharyngeal erythema.  Eyes:     General: No scleral icterus.    Extraocular Movements: Extraocular movements intact.     Conjunctiva/sclera: Conjunctivae normal.     Pupils: Pupils are equal, round, and reactive to light.   Neck:     Thyroid: No thyromegaly.     Vascular: No carotid bruit or JVD.  Cardiovascular:     Rate and Rhythm: Normal rate and regular rhythm.     Pulses: Normal pulses.     Heart sounds: Normal heart sounds. No gallop.   Pulmonary:     Effort: Pulmonary effort is normal. No respiratory distress.     Breath sounds: Normal breath sounds. No wheezing.     Comments: Good air exch Chest:     Chest wall: No tenderness.  Abdominal:     General: Bowel sounds are normal. There is no distension or abdominal bruit.     Palpations: Abdomen is soft. There is no mass.     Tenderness: There is no abdominal tenderness.     Hernia: No hernia is present.  Genitourinary:    Comments: Breast and pelvic exam done by gyn Musculoskeletal:        General: No tenderness. Normal range of motion.     Cervical back: Normal range of motion and neck supple. No rigidity. No muscular tenderness.     Right lower leg: No edema.     Left lower leg: No edema.  Lymphadenopathy:     Cervical: No cervical adenopathy.  Skin:    General: Skin is warm and dry.     Coloration: Skin is not pale.     Findings: No erythema or rash.     Comments: Multiple brown nevi with scars from prior derm surgeries on back and trunk  Neurological:  Mental Status: She is alert. Mental status is at baseline.     Cranial Nerves: No cranial nerve deficit.     Motor: No abnormal muscle tone.     Coordination: Coordination normal.     Gait: Gait normal.     Deep Tendon Reflexes: Reflexes are normal and symmetric. Reflexes normal.  Psychiatric:        Mood and Affect: Mood normal.        Cognition and Memory: Cognition and memory normal.           Assessment & Plan:   Problem List Items Addressed This Visit      Musculoskeletal and Integument   Rash and nonspecific skin eruption    improved        Other   Routine general medical examination at a health care facility - Primary    Reviewed health habits including  diet and exercise and skin cancer prevention Reviewed appropriate screening tests for age  Also reviewed health mt list, fam hx and immunization status , as well as social and family history   See HPI Labs reviewed  Good exercise  utd gyn care  Discussed possible trial of OC for PMDD and mid month cramping  imms up to date incl covid  Plan first colonoscopy at 36 due to family hx of polyps  Cholesterol is improved      Blurred vision    This has not recurred

## 2020-11-23 NOTE — Assessment & Plan Note (Signed)
improved

## 2020-12-02 ENCOUNTER — Encounter: Payer: 59 | Admitting: Family Medicine

## 2020-12-25 ENCOUNTER — Other Ambulatory Visit: Payer: 59

## 2021-04-17 ENCOUNTER — Telehealth: Payer: 59 | Admitting: Orthopedic Surgery

## 2021-04-17 DIAGNOSIS — U071 COVID-19: Secondary | ICD-10-CM | POA: Diagnosis not present

## 2021-04-17 NOTE — Progress Notes (Signed)
   Subjective: Victorian had developed a tickle in her throat on Wednesday. This continued until this morning when she also had a slight cough. She did a home Covid test which turned out to be positive. Her symptoms are very mild and she does not feel she needs any medication aside from OTC analgesics possibly. This visit was conducted via video.    Assessment/Plan: Covid -- Went over CDC guidelines for isolation and masking. Encouraged patient to reach out if her symptoms worsen.    Freeman Caldron, PA-C Orthopedic Surgery [phone] 04/17/2021

## 2021-07-13 LAB — HM PAP SMEAR: HM Pap smear: NEGATIVE

## 2021-11-09 ENCOUNTER — Telehealth: Payer: Self-pay | Admitting: Family Medicine

## 2021-11-09 DIAGNOSIS — Z0000 Encounter for general adult medical examination without abnormal findings: Secondary | ICD-10-CM

## 2021-11-09 NOTE — Telephone Encounter (Signed)
-----   Message from Alvina Chou sent at 10/25/2021 10:54 AM EST ----- Regarding: Lab orders for  Wednesday, 12.7.22 Patient is scheduled for CPX labs, please order future labs, Thanks , Camelia Eng

## 2021-11-10 ENCOUNTER — Other Ambulatory Visit: Payer: 59

## 2021-11-11 ENCOUNTER — Other Ambulatory Visit: Payer: 59

## 2021-11-15 ENCOUNTER — Other Ambulatory Visit: Payer: 59

## 2021-11-22 ENCOUNTER — Encounter: Payer: 59 | Admitting: Family Medicine

## 2021-11-22 ENCOUNTER — Other Ambulatory Visit: Payer: Self-pay

## 2021-12-07 ENCOUNTER — Other Ambulatory Visit: Payer: Self-pay

## 2021-12-07 ENCOUNTER — Other Ambulatory Visit (INDEPENDENT_AMBULATORY_CARE_PROVIDER_SITE_OTHER): Payer: 59

## 2021-12-07 DIAGNOSIS — Z0000 Encounter for general adult medical examination without abnormal findings: Secondary | ICD-10-CM | POA: Diagnosis not present

## 2021-12-07 LAB — CBC WITH DIFFERENTIAL/PLATELET
Basophils Absolute: 0 10*3/uL (ref 0.0–0.1)
Basophils Relative: 0.4 % (ref 0.0–3.0)
Eosinophils Absolute: 0.1 10*3/uL (ref 0.0–0.7)
Eosinophils Relative: 2.1 % (ref 0.0–5.0)
HCT: 43.1 % (ref 36.0–46.0)
Hemoglobin: 14.6 g/dL (ref 12.0–15.0)
Lymphocytes Relative: 34.1 % (ref 12.0–46.0)
Lymphs Abs: 1.8 10*3/uL (ref 0.7–4.0)
MCHC: 33.8 g/dL (ref 30.0–36.0)
MCV: 92.4 fl (ref 78.0–100.0)
Monocytes Absolute: 0.5 10*3/uL (ref 0.1–1.0)
Monocytes Relative: 9.6 % (ref 3.0–12.0)
Neutro Abs: 2.8 10*3/uL (ref 1.4–7.7)
Neutrophils Relative %: 53.8 % (ref 43.0–77.0)
Platelets: 254 10*3/uL (ref 150.0–400.0)
RBC: 4.66 Mil/uL (ref 3.87–5.11)
RDW: 13.1 % (ref 11.5–15.5)
WBC: 5.1 10*3/uL (ref 4.0–10.5)

## 2021-12-07 LAB — COMPREHENSIVE METABOLIC PANEL
ALT: 17 U/L (ref 0–35)
AST: 16 U/L (ref 0–37)
Albumin: 4.3 g/dL (ref 3.5–5.2)
Alkaline Phosphatase: 60 U/L (ref 39–117)
BUN: 12 mg/dL (ref 6–23)
CO2: 26 mEq/L (ref 19–32)
Calcium: 9.2 mg/dL (ref 8.4–10.5)
Chloride: 102 mEq/L (ref 96–112)
Creatinine, Ser: 0.81 mg/dL (ref 0.40–1.20)
GFR: 94.57 mL/min (ref >60.00)
Glucose, Bld: 86 mg/dL (ref 70–99)
Potassium: 4.3 mEq/L (ref 3.5–5.1)
Sodium: 137 mEq/L (ref 135–145)
Total Bilirubin: 0.7 mg/dL (ref 0.2–1.2)
Total Protein: 7 g/dL (ref 6.0–8.3)

## 2021-12-07 LAB — LIPID PANEL
Cholesterol: 206 mg/dL — ABNORMAL HIGH (ref 0–200)
HDL: 71.5 mg/dL (ref >39.00)
LDL Cholesterol: 121 mg/dL — ABNORMAL HIGH (ref 0–99)
NonHDL: 134.79
Total CHOL/HDL Ratio: 3
Triglycerides: 69 mg/dL (ref 0.0–149.0)
VLDL: 13.8 mg/dL (ref 0.0–40.0)

## 2021-12-07 LAB — TSH: TSH: 5.69 u[IU]/mL — ABNORMAL HIGH (ref 0.35–5.50)

## 2021-12-08 ENCOUNTER — Encounter: Payer: Self-pay | Admitting: Family Medicine

## 2021-12-08 ENCOUNTER — Ambulatory Visit (INDEPENDENT_AMBULATORY_CARE_PROVIDER_SITE_OTHER): Payer: 59 | Admitting: Family Medicine

## 2021-12-08 VITALS — BP 116/78 | HR 89 | Temp 98.0°F | Ht 68.75 in | Wt 224.4 lb

## 2021-12-08 DIAGNOSIS — Z23 Encounter for immunization: Secondary | ICD-10-CM | POA: Diagnosis not present

## 2021-12-08 DIAGNOSIS — R7989 Other specified abnormal findings of blood chemistry: Secondary | ICD-10-CM | POA: Insufficient documentation

## 2021-12-08 DIAGNOSIS — Z8371 Family history of colonic polyps: Secondary | ICD-10-CM | POA: Diagnosis not present

## 2021-12-08 DIAGNOSIS — Z0000 Encounter for general adult medical examination without abnormal findings: Secondary | ICD-10-CM | POA: Diagnosis not present

## 2021-12-08 DIAGNOSIS — F411 Generalized anxiety disorder: Secondary | ICD-10-CM

## 2021-12-08 NOTE — Assessment & Plan Note (Signed)
Lab Results  Component Value Date   TSH 5.69 (H) 12/07/2021   Some fatigue  Will plan to re check with FT4 in 1-2 mo

## 2021-12-08 NOTE — Assessment & Plan Note (Addendum)
Reviewed health habits including diet and exercise and skin cancer prevention Reviewed appropriate screening tests for age  Also reviewed health mt list, fam hx and immunization status , as well as social and family history   See HPI Labs reviewed  covid immunized with recent bivalent vaccine  Flu shot given today  Pap utd-[facility] ([facility]yn care there) Will need colonoscopy at 37, plans to call us in April to place the referral (family history of colon polyps and cancer)  TSH elevated -will plan to re check with thyroid profile  Encouraged good self care even through stressors Cholesterol up a bit but very good HDL Handling stressors well/ discussed mood

## 2021-12-08 NOTE — Patient Instructions (Addendum)
Call us to do a colonoscopy referral in April   For cholesterol  Avoid red meat/ fried foods/ egg yolks/ fatty breakfast meats/ butter, cheese and high fat dairy/ and shellfish   Keep exercising also   Take care of yourself  Continue counseling   We will watch the thyroid numbers  Schedule labs in 1-2 months

## 2021-12-08 NOTE — Assessment & Plan Note (Signed)
Some anxiety with losses in the past year Rev PHQ- score of 8 Pt thinks she is coping well despite this Discussed option for counseling if she wants it later

## 2021-12-08 NOTE — Assessment & Plan Note (Signed)
Pt was told by her father's physician that he had a precancerous polyp and that she would need a screening colonoscopy at 36  She will call in April for a referral

## 2021-12-08 NOTE — Progress Notes (Signed)
Subjective:    Patient ID: Tracey Garcia, female    DOB: [birth year], 36 y.o.   MRN: [id number]  This visit occurred during the SARS-CoV-2 public health emergency.  Safety protocols were in place, including screening questions prior to the visit, additional usage of staff PPE, and extensive cleaning of exam room while observing appropriate contact time as indicated for disinfecting solutions.   HPI Here for health maintenance exam and to review chronic medical problems    Wt Readings from Last 3 Encounters:  12/08/21 224 lb 6 oz (101.8 kg)  11/23/20 222 lb 2 oz (100.8 kg)  11/18/20 227 lb 5 oz (103.1 kg)   33.38 kg/m A rough year  A lot of tragedy-lost BIL unexpectedly  Her mother's cancer is back - both lungs   Depression screen [facility] 2/9 12/08/2021 11/23/2020 11/20/2019 11/14/2018 08/23/2017  Decreased Interest 1 0 0 0 0  Down, Depressed, Hopeless 1 0 0 0 0  PHQ - 2 Score 2 0 0 0 0  Altered sleeping 0 1 1 1 1   Tired, decreased energy 1 1 1 1 1   Change in appetite 0 0 0 0 0  Feeling bad or failure about yourself  2 1 1 1 1   Trouble concentrating 2 1 0 1 1  Moving slowly or fidgety/restless 1 0 0 0 0  Suicidal thoughts 0 0 0 0 0  PHQ-9 Score 8 4 3 4 4   Difficult doing work/chores Somewhat difficult Not difficult at all - - -     Did get covid-not too bad  Taking care of herself -trying to cope as well as she can Exercising - for 2 weeks now with her husband  Walking and pickle ball and videos and strength training   Had initial counseling intake and will start soon   Covid immunized  - did have the bivalent dec 22  Flu shot-today  Tdap 2016  Pap 04/2018-neg at gyn Menses- fine /light   (has PMS symptoms)  Does not need contraception  Went to gyn in [place] and pap was nl   Self breast exam  : no lumps   Colonoscopy was recommended at 36 yo by her GI doctor -turns 28 in May Due to family history (father had high risk polyps around age 69)  Mother had celiac dz GGM had  colon cancer   BP Readings from Last 3 Encounters:  12/08/21 116/78  11/23/20 124/78  11/18/20 122/80   Pulse Readings from Last 3 Encounters:  12/08/21 89  11/23/20 75  11/18/20 85    Cholesterol Lab Results  Component Value Date   CHOL 206 (H) 12/07/2021   CHOL 193 11/16/2020   CHOL 204 (H) 11/18/2019   Lab Results  Component Value Date   HDL 71.50 12/07/2021   HDL 71.00 11/16/2020   HDL 72.40 11/18/2019   Lab Results  Component Value Date   LDLCALC 121 (H) 12/07/2021   LDLCALC 109 (H) 11/16/2020   LDLCALC 120 (H) 11/18/2019   Lab Results  Component Value Date   TRIG 69.0 12/07/2021   TRIG 65.0 11/16/2020   TRIG 62.0 11/18/2019   Lab Results  Component Value Date   CHOLHDL 3 12/07/2021   CHOLHDL 3 11/16/2020   CHOLHDL 3 11/18/2019   No results found for: LDLDIRECT  Diet was not a priority  Some red meat and fried foods     Other labs Lab Results  Component Value Date   CREATININE 0.81 12/07/2021   BUN 12  12/07/2021   NA 137 12/07/2021   K 4.3 12/07/2021   CL 102 12/07/2021   CO2 26 12/07/2021   Glucose 86 Lab Results  Component Value Date   ALT 17 12/07/2021   AST 16 12/07/2021   ALKPHOS 60 12/07/2021   BILITOT 0.7 12/07/2021   Lab Results  Component Value Date   WBC 5.1 12/07/2021   HGB 14.6 12/07/2021   HCT 43.1 12/07/2021   MCV 92.4 12/07/2021   PLT 254.0 12/07/2021   Lab Results  Component Value Date   TSH 5.69 (H) 12/07/2021    Some thyroid issues in the family  Hyper   No supplements/no vitamins    Patient Active Problem List   Diagnosis Date Noted   Elevated TSH 12/08/2021   Right knee pain 08/30/2017   Family history of colonic polyps 08/23/2017   Recurrent sinusitis 10/23/2015   Routine general medical examination at a health care facility 10/20/2014   IBS (irritable bowel syndrome) 11/04/2013   Generalized anxiety disorder 11/04/2013   Past Medical History:  Diagnosis Date   Anxiety    Anxiety    History  of IBS    History of seasonal allergies    Past Surgical History:  Procedure Laterality Date   UMBILICAL HERNIA REPAIR Bilateral 01/2005   Social History   Tobacco Use   Smoking status: Never   Smokeless tobacco: Never  Substance Use Topics   Alcohol use: Yes    Alcohol/week: 0.0 standard drinks    Comment: socially   Drug use: No   Family History  Problem Relation Age of Onset   Cancer Mother    Celiac disease Mother    Lung cancer Mother    Other Mother        AV node Tachycardia   Heart attack Paternal Grandfather    Arthritis Maternal Grandmother    High blood pressure Maternal Grandmother    Sjogren's syndrome Maternal Grandmother    Colon cancer Unknown        great grandmother   High Cholesterol Father    Syncope episode Father        micturition syncope   Colon polyps Father    Heart disease Maternal Grandfather    Diabetes Maternal Grandfather    High blood pressure Paternal Grandmother    Allergies  Allergen Reactions   Chlorine     Trouble breathing    Current Outpatient Medications on File Prior to Visit  Medication Sig Dispense Refill   cetirizine (ZYRTEC) 10 MG tablet Take 10 mg by mouth daily.     fluticasone (FLONASE) 50 MCG/ACT nasal spray Place 2 sprays into both nostrils daily as needed for allergies or rhinitis.     No current facility-administered medications on file prior to visit.    Review of Systems  Constitutional:  Positive for fatigue. Negative for activity change, appetite change, fever and unexpected weight change.  HENT:  Negative for congestion, ear pain, rhinorrhea, sinus pressure and sore throat.   Eyes:  Negative for pain, redness and visual disturbance.  Respiratory:  Negative for cough, shortness of breath and wheezing.   Cardiovascular:  Negative for chest pain and palpitations.  Gastrointestinal:  Negative for abdominal pain, blood in stool, constipation and diarrhea.  Endocrine: Negative for polydipsia and polyuria.   Genitourinary:  Negative for dysuria, frequency and urgency.  Musculoskeletal:  Negative for arthralgias, back pain and myalgias.  Skin:  Negative for pallor and rash.  Allergic/Immunologic: Negative for environmental allergies.  Neurological:  Negative for dizziness, syncope and headaches.  Hematological:  Negative for adenopathy. Does not bruise/bleed easily.  Psychiatric/Behavioral:  Negative for decreased concentration and dysphoric mood. The patient is nervous/anxious.       Objective:   Physical Exam Constitutional:      General: She is not in acute distress.    Appearance: Normal appearance. She is well-developed. She is obese. She is not ill-appearing or diaphoretic.     Comments: Large frame  HENT:     Head: Normocephalic and atraumatic.     Right Ear: Tympanic membrane, ear canal and external ear normal.     Left Ear: Tympanic membrane, ear canal and external ear normal.     Nose: Nose normal. No congestion.     Mouth/Throat:     Mouth: Mucous membranes are moist.     Pharynx: Oropharynx is clear. No posterior oropharyngeal erythema.  Eyes:     General: No scleral icterus.    Extraocular Movements: Extraocular movements intact.     Conjunctiva/sclera: Conjunctivae normal.     Pupils: Pupils are equal, round, and reactive to light.  Neck:     Thyroid: No thyromegaly.     Vascular: No carotid bruit or JVD.  Cardiovascular:     Rate and Rhythm: Normal rate and regular rhythm.     Pulses: Normal pulses.     Heart sounds: Normal heart sounds.    No gallop.  Pulmonary:     Effort: Pulmonary effort is normal. No respiratory distress.     Breath sounds: Normal breath sounds. No wheezing.     Comments: Good air exch Chest:     Chest wall: No tenderness.  Abdominal:     General: Bowel sounds are normal. There is no distension or abdominal bruit.     Palpations: Abdomen is soft. There is no mass.     Tenderness: There is no abdominal tenderness.     Hernia: No hernia is  present.  Genitourinary:    Comments: Breast and pelvic exam are done at gyn office   Musculoskeletal:        General: No tenderness. Normal range of motion.     Cervical back: Normal range of motion and neck supple. No rigidity. No muscular tenderness.     Right lower leg: No edema.     Left lower leg: No edema.  Lymphadenopathy:     Cervical: No cervical adenopathy.  Skin:    General: Skin is warm and dry.     Coloration: Skin is not pale.     Findings: No erythema or rash.     Comments: Many brown nevi of different sizes on back/trunk Baseline  Per pt-has regular dermatology visits  Neurological:     Mental Status: She is alert. Mental status is at baseline.     Cranial Nerves: No cranial nerve deficit.     Motor: No abnormal muscle tone.     Coordination: Coordination normal.     Gait: Gait normal.     Deep Tendon Reflexes: Reflexes are normal and symmetric. Reflexes normal.  Psychiatric:        Mood and Affect: Mood normal.        Cognition and Memory: Cognition and memory normal.          Assessment & Plan:   Problem List Items Addressed This Visit       Other   Generalized anxiety disorder    Some anxiety with losses in the past year Rev PHQ- score  of 8 Pt thinks she is coping well despite this Discussed option for counseling if she wants it later        Routine general medical examination at a health care facility - Primary    Reviewed health habits including diet and exercise and skin cancer prevention Reviewed appropriate screening tests for age  Also reviewed health mt list, fam hx and immunization status , as well as social and family history   See HPI Labs reviewed  covid immunized with recent bivalent vaccine  Flu shot given today  Pap utd-[facility] ([facility] care there) Will need colonoscopy at 50, plans to call us in April to place the referral (family history of colon polyps and cancer)  TSH elevated -will plan  to re check with thyroid profile  Encouraged good self care even through stressors Cholesterol up a bit but very good HDL Handling stressors well/ discussed mood      Relevant Orders   Flu Vaccine QUAD 6+ mos PF IM (Fluarix Quad PF) (Completed)   Family history of colonic polyps    Pt was told by her father's physician that he had a precancerous polyp and that she would need a screening colonoscopy at 75  She will call in April for a referral      Elevated TSH    Lab Results  Component Value Date   TSH 5.69 (H) 12/07/2021  Some fatigue  Will plan to re check with FT4 in 1-2 mo      Other Visit Diagnoses     Need for influenza vaccination       Relevant Orders   Flu Vaccine QUAD 6+ mos PF IM (Fluarix Quad PF) (Completed)

## 2022-02-07 ENCOUNTER — Encounter: Payer: Self-pay | Admitting: Family Medicine

## 2022-02-07 ENCOUNTER — Other Ambulatory Visit: Payer: Self-pay

## 2022-02-07 ENCOUNTER — Other Ambulatory Visit (INDEPENDENT_AMBULATORY_CARE_PROVIDER_SITE_OTHER): Payer: 59

## 2022-02-07 DIAGNOSIS — R7989 Other specified abnormal findings of blood chemistry: Secondary | ICD-10-CM

## 2022-02-07 DIAGNOSIS — E039 Hypothyroidism, unspecified: Secondary | ICD-10-CM | POA: Insufficient documentation

## 2022-02-07 LAB — T4, FREE: Free T4: 0.79 ng/dL (ref 0.60–1.60)

## 2022-02-07 LAB — TSH: TSH: 7.22 u[IU]/mL — ABNORMAL HIGH (ref 0.35–5.50)

## 2022-02-11 ENCOUNTER — Other Ambulatory Visit: Payer: Self-pay

## 2022-02-11 MED ORDER — LEVOTHYROXINE SODIUM 25 MCG PO TABS: 25.0000 ug | ORAL_TABLET | Freq: Every day | ORAL | 0 refills | Status: DC

## 2022-02-11 NOTE — Progress Notes (Signed)
Per labs, sent levothyroxine 25 mcg to [facility] ?

## 2022-04-11 ENCOUNTER — Ambulatory Visit: Payer: 59 | Admitting: Family Medicine

## 2022-04-11 ENCOUNTER — Encounter: Payer: Self-pay | Admitting: Family Medicine

## 2022-04-11 VITALS — BP 120/80 | HR 86 | Ht 68.0 in | Wt 226.4 lb

## 2022-04-11 DIAGNOSIS — Z8371 Family history of colonic polyps: Secondary | ICD-10-CM

## 2022-04-11 DIAGNOSIS — E039 Hypothyroidism, unspecified: Secondary | ICD-10-CM

## 2022-04-11 DIAGNOSIS — R5382 Chronic fatigue, unspecified: Secondary | ICD-10-CM

## 2022-04-11 DIAGNOSIS — R5383 Other fatigue: Secondary | ICD-10-CM | POA: Insufficient documentation

## 2022-04-11 NOTE — Assessment & Plan Note (Signed)
Referral done for screening colonoscopy  ?Father had polyps  ?Was told his children need to start colonoscopies for screening at 36 ? ?

## 2022-04-11 NOTE — Assessment & Plan Note (Signed)
With hypothyroidism ?May be multifactorial  ?Sleep is fair/some snoring but does not nod off ?Takes zyrtec for allergies-may be worth a trial off for a day or 2 to see if that makes a difference ?Some anxious symptoms as well  ?Disc importance of exercise  ?Lab today for TSH and iron levels  ?

## 2022-04-11 NOTE — Assessment & Plan Note (Signed)
Now taking levothyroxine 25 mcg daily  ? ?Some fatigue  ?Some anxiety  ?Sleep is fair  ? ?TSH drawn  ?Will titrate medication if needed ?She takes is correctly  ?

## 2022-04-11 NOTE — Patient Instructions (Addendum)
Labs today  ? ?Take care of yourself  ? ?Call the GI office to set up a colonoscopy  ? ? ?Colonoscopies will be called according to date/time of the referral entry as these are not of urgent need.   ? ?[facility]  [phone] ? ? ? ?

## 2022-04-11 NOTE — Progress Notes (Signed)
? ?Subjective:  ? ? Patient ID: Tracey Garcia, female    DOB: [birth year], 36 y.o.   MRN: [id number] ? ?HPI ?Pt presents for f/u of hypothyroidism ? ?Wt Readings from Last 3 Encounters:  ?04/11/22 226 lb 6.4 oz (102.7 kg)  ?12/08/21 224 lb 6 oz (101.8 kg)  ?11/23/20 222 lb 2 oz (100.8 kg)  ? ?34.42 kg/m? ? ?Doing ok  ? ?Does feel more tired  ?Like she could sleep  ?Feels hungry but does not finish her food / gets full faster  ? ?Thinks she does snore  ?Of note, her mother has sleep apnea -was told she could be generic  ?Father has it also  ? ?Sometimes she wakes up not feeling rested  ?Does not nod off easily  ?Aims for 8 hours per night of sleep but averages 7  ?? If she feels any better with more  ? ?Her husband keeps her up with his snoring a bit  ? ?Takes zyrtec for allergies  ? ? ?A little more emotional/anxious lately  ? ?Doing therapy and that has made her work through some thing  ? ?Trying to work in self care  ?Hard to fit in exercise /got off track with exercise with vacation in April  ? ? ?Lab Results  ?Component Value Date  ? TSH 7.22 (H) 02/07/2022  ? ?Free T4 of 0.79 ? ?We started levothyroxine 25 mcg daily  ? ?Periods are about the same  ?No changes  ?Pretty light since IUD removed  ? ?Needs referral for colonoscopy  ? ?Patient Active Problem List  ? Diagnosis Date Noted  ? Fatigue 04/11/2022  ? Hypothyroid 02/07/2022  ? Elevated TSH 12/08/2021  ? Right knee pain 08/30/2017  ? Family history of colonic polyps 08/23/2017  ? Recurrent sinusitis 10/23/2015  ? Routine general medical examination at a health care facility 10/20/2014  ? IBS (irritable bowel syndrome) 11/04/2013  ? Generalized anxiety disorder 11/04/2013  ? ?Past Medical History:  ?Diagnosis Date  ? Anxiety   ? Anxiety   ? History of IBS   ? History of seasonal allergies   ? ?Past Surgical History:  ?Procedure Laterality Date  ? UMBILICAL HERNIA REPAIR Bilateral 01/2005  ? ?Social History  ? ?Tobacco Use  ? Smoking status: Never  ? Smokeless  tobacco: Never  ?Vaping Use  ? Vaping Use: Never used  ?Substance Use Topics  ? Alcohol use: Yes  ?  Alcohol/week: 0.0 standard drinks  ?  Comment: socially  ? Drug use: No  ? ?Family History  ?Problem Relation Age of Onset  ? Cancer Mother   ? Celiac disease Mother   ? Lung cancer Mother   ? Other Mother   ?     AV node Tachycardia  ? Heart attack Paternal Grandfather   ? Arthritis Maternal Grandmother   ? High blood pressure Maternal Grandmother   ? Sjogren's syndrome Maternal Grandmother   ? Colon cancer Unknown   ?     great grandmother  ? High Cholesterol Father   ? Syncope episode Father   ?     micturition syncope  ? Colon polyps Father   ? Heart disease Maternal Grandfather   ? Diabetes Maternal Grandfather   ? High blood pressure Paternal Grandmother   ? ?Allergies  ?Allergen Reactions  ? Chlorine   ?  Trouble breathing   ? ?Current Outpatient Medications on File Prior to Visit  ?Medication Sig Dispense Refill  ? cetirizine (ZYRTEC) 10 MG tablet Take  10 mg by mouth daily.    ? fluticasone (FLONASE) 50 MCG/ACT nasal spray Place 2 sprays into both nostrils daily as needed for allergies or rhinitis.    ? levothyroxine (SYNTHROID) 25 MCG tablet Take 1 tablet (25 mcg total) by mouth daily. 90 tablet 0  ? ?No current facility-administered medications on file prior to visit.  ?  ?Review of Systems  ?Constitutional:  Positive for fatigue. Negative for activity change, appetite change, fever and unexpected weight change.  ?HENT:  Negative for congestion, ear pain, rhinorrhea, sinus pressure and sore throat.   ?Eyes:  Negative for pain, redness and visual disturbance.  ?Respiratory:  Negative for cough, shortness of breath and wheezing.   ?Cardiovascular:  Negative for chest pain and palpitations.  ?Gastrointestinal:  Negative for abdominal pain, blood in stool, constipation and diarrhea.  ?Endocrine: Negative for polydipsia and polyuria.  ?Genitourinary:  Negative for dysuria, frequency and urgency.   ?Musculoskeletal:  Negative for arthralgias, back pain and myalgias.  ?Skin:  Negative for pallor and rash.  ?Allergic/Immunologic: Negative for environmental allergies.  ?Neurological:  Negative for dizziness, syncope and headaches.  ?Hematological:  Negative for adenopathy. Does not bruise/bleed easily.  ?Psychiatric/Behavioral:  Negative for decreased concentration and dysphoric mood. The patient is nervous/anxious.   ?     More emotional   ? ?   ?Objective:  ? Physical Exam ?Constitutional:   ?   General: She is not in acute distress. ?   Appearance: Normal appearance. She is well-developed. She is obese. She is not ill-appearing or diaphoretic.  ?HENT:  ?   Head: Normocephalic and atraumatic.  ?   Mouth/Throat:  ?   Mouth: Mucous membranes are moist.  ?Eyes:  ?   Conjunctiva/sclera: Conjunctivae normal.  ?   Pupils: Pupils are equal, round, and reactive to light.  ?Neck:  ?   Thyroid: No thyromegaly.  ?   Vascular: No carotid bruit or JVD.  ?   Comments: No thyroid abnormalities  ?Cardiovascular:  ?   Rate and Rhythm: Normal rate and regular rhythm.  ?   Heart sounds: Normal heart sounds.  ?  No gallop.  ?Pulmonary:  ?   Effort: Pulmonary effort is normal. No respiratory distress.  ?   Breath sounds: Normal breath sounds. No wheezing or rales.  ?Abdominal:  ?   General: There is no distension or abdominal bruit.  ?   Palpations: Abdomen is soft.  ?Musculoskeletal:  ?   Cervical back: Normal range of motion and neck supple.  ?   Right lower leg: No edema.  ?   Left lower leg: No edema.  ?Lymphadenopathy:  ?   Cervical: No cervical adenopathy.  ?Skin: ?   General: Skin is warm and dry.  ?   Coloration: Skin is not pale.  ?   Findings: No rash.  ?Neurological:  ?   Mental Status: She is alert.  ?   Coordination: Coordination normal.  ?   Deep Tendon Reflexes: Reflexes are normal and symmetric. Reflexes normal.  ?Psychiatric:     ?   Mood and Affect: Mood normal.  ? ? ? ? ? ?   ?Assessment & Plan:  ? ?Problem List  Items Addressed This Visit   ? ?  ? Endocrine  ? Hypothyroid - Primary  ?  Now taking levothyroxine 25 mcg daily  ? ?Some fatigue  ?Some anxiety  ?Sleep is fair  ? ?TSH drawn  ?Will titrate medication if needed ?She takes is correctly  ? ?  ?  ?  Relevant Orders  ? TSH  ?  ? Other  ? Family history of colonic polyps  ?  Referral done for screening colonoscopy  ?Father had polyps  ?Was told his children need to start colonoscopies for screening at 35 ? ? ?  ?  ? Relevant Orders  ? Ambulatory referral to Gastroenterology  ? Fatigue  ?  With hypothyroidism ?May be multifactorial  ?Sleep is fair/some snoring but does not nod off ?Takes zyrtec for allergies-may be worth a trial off for a day or 2 to see if that makes a difference ?Some anxious symptoms as well  ?Disc importance of exercise  ?Lab today for TSH and iron levels  ? ?  ?  ? Relevant Orders  ? TSH  ? Iron  ? Ferritin  ? CBC with Differential/Platelet  ? ? ?

## 2022-04-12 LAB — CBC WITH DIFFERENTIAL/PLATELET
Basophils Absolute: 0 10*3/uL (ref 0.0–0.1)
Basophils Relative: 0.5 % (ref 0.0–3.0)
Eosinophils Absolute: 0.1 10*3/uL (ref 0.0–0.7)
Eosinophils Relative: 1.3 % (ref 0.0–5.0)
HCT: 41.2 % (ref 36.0–46.0)
Hemoglobin: 14.2 g/dL (ref 12.0–15.0)
Lymphocytes Relative: 23.9 % (ref 12.0–46.0)
Lymphs Abs: 1.4 10*3/uL (ref 0.7–4.0)
MCHC: 34.5 g/dL (ref 30.0–36.0)
MCV: 93.1 fl (ref 78.0–100.0)
Monocytes Absolute: 0.5 10*3/uL (ref 0.1–1.0)
Monocytes Relative: 7.9 % (ref 3.0–12.0)
Neutro Abs: 4 10*3/uL (ref 1.4–7.7)
Neutrophils Relative %: 66.4 % (ref 43.0–77.0)
Platelets: 251 10*3/uL (ref 150.0–400.0)
RBC: 4.43 Mil/uL (ref 3.87–5.11)
RDW: 13 % (ref 11.5–15.5)
WBC: 6 10*3/uL (ref 4.0–10.5)

## 2022-04-12 LAB — FERRITIN: Ferritin: 47 ng/mL (ref 10.0–291.0)

## 2022-04-12 LAB — IRON: Iron: 86 ug/dL (ref 42–145)

## 2022-04-12 LAB — TSH: TSH: 1.64 u[IU]/mL (ref 0.35–5.50)

## 2022-04-13 ENCOUNTER — Other Ambulatory Visit: Payer: Self-pay

## 2022-04-13 MED ORDER — LEVOTHYROXINE SODIUM 25 MCG PO TABS: 25.0000 ug | ORAL_TABLET | Freq: Every day | ORAL | 2 refills | Status: DC

## 2022-06-02 ENCOUNTER — Encounter: Payer: Self-pay | Admitting: Internal Medicine

## 2022-07-04 ENCOUNTER — Encounter: Payer: Self-pay | Admitting: Internal Medicine

## 2022-07-05 ENCOUNTER — Ambulatory Visit: Payer: 59 | Admitting: Internal Medicine

## 2022-08-05 ENCOUNTER — Ambulatory Visit: Payer: 59 | Admitting: Internal Medicine

## 2022-08-05 ENCOUNTER — Encounter: Payer: Self-pay | Admitting: Internal Medicine

## 2022-08-05 VITALS — BP 134/80 | HR 88 | Ht 68.0 in | Wt 224.0 lb

## 2022-08-05 DIAGNOSIS — Z8371 Family history of colonic polyps: Secondary | ICD-10-CM | POA: Diagnosis not present

## 2022-08-05 MED ORDER — SUTAB 1479-225-188 MG PO TABS: 1.0000 | ORAL_TABLET | Freq: Once | ORAL | 0 refills | Status: AC

## 2022-08-05 MED ORDER — NA SULFATE-K SULFATE-MG SULF 17.5-3.13-1.6 GM/177ML PO SOLN: 1.0000 | Freq: Once | ORAL | 0 refills | Status: DC

## 2022-08-05 NOTE — Patient Instructions (Signed)
_______________________________________________________  If you are age 35 or older, your body mass index should be between 23-30. Your Body mass index is 34.06 kg/m. If this is out of the aforementioned range listed, please consider follow up with your Primary Care Provider.  If you are age 15 or younger, your body mass index should be between 19-25. Your Body mass index is 34.06 kg/m. If this is out of the aformentioned range listed, please consider follow up with your Primary Care Provider.   You have been scheduled for a colonoscopy. Please follow written instructions given to you at your visit today.  Please pick up your prep supplies at the pharmacy within the next 1-3 days. If you use inhalers (even only as needed), please bring them with you on the day of your procedure.  The [facility] providers would like to encourage you to use [facility] to communicate with providers for non-urgent requests or questions.  Due to long hold times on the telephone, sending your provider a message by [facility] may be a faster and more efficient way to get a response.  Please allow 48 business hours for a response.  Please remember that this is for non-urgent requests.   Thank you for entrusting me with your care and for choosing [facility], Dr. Eulah Pont

## 2022-08-05 NOTE — Progress Notes (Signed)
Chief Complaint: Consider colonoscopy  HPI : 36 year old female with history of Jehovah's witness, anxiety and hypothyroidism presents to discuss a colonoscopy procedure.  Denies blood in stools and weight loss. She has been diagnosed with IBS in the past and has alternating constipation and diarrhea. Denies ab pain, N&V, dysphagia, or acid reflux issues. Father had advanced adenomatous colon polyps. Per her father's gastroenterologist, the patient needs to start colonoscopies for colon cancer screening starting at age 29. Great grandmother had colon cancer. Deneis prior colonoscopy. Patient is not aware of any genetic cancer syndromes in her family. 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  Past Medical History:  Diagnosis Date   Anxiety    Anxiety    History of IBS    History of seasonal allergies    Thyroid disease     Past Surgical History:  Procedure Laterality Date   UMBILICAL HERNIA REPAIR Bilateral 01/2005   Family History  Problem Relation Age of Onset   Cancer Mother    Celiac disease Mother    Lung cancer Mother    Other Mother        AV node Tachycardia   High Cholesterol Father    Syncope episode Father        micturition syncope   Colon polyps Father    Arthritis Maternal Grandmother    High blood pressure Maternal Grandmother    Sjogren's syndrome Maternal Grandmother    Heart disease Maternal Grandfather    Diabetes Maternal Grandfather    High blood pressure Paternal Grandmother    Heart attack Paternal Grandfather    Colon cancer Other        great grandmother   Stomach cancer Neg Hx    Esophageal cancer Neg Hx    Social History   Tobacco Use   Smoking status: Never   Smokeless tobacco: Never  Vaping Use   Vaping Use: Never used  Substance Use Topics   Alcohol use: Yes    Alcohol/week: 0.0 standard drinks of alcohol    Comment: socially   Drug use: No   Current Outpatient Medications  Medication Sig Dispense  Refill   cetirizine (ZYRTEC) 10 MG tablet Take 10 mg by mouth daily.     fluticasone (FLONASE) 50 MCG/ACT nasal spray Place 2 sprays into both nostrils daily as needed for allergies or rhinitis.     levothyroxine (SYNTHROID) 25 MCG tablet Take 1 tablet (25 mcg total) by mouth daily. 90 tablet 2   No current facility-administered medications for this visit.   Allergies  Allergen Reactions   Chlorine     Trouble breathing    Review of Systems: All systems reviewed and negative except where noted in HPI.   Physical Exam: BP 134/80   Pulse 88   Ht 5\' 8"  (1.727 m)   Wt 224 lb (101.6 kg)   BMI 34.06 kg/m  Constitutional: Pleasant,well-developed, female in no acute distress. HEENT: Normocephalic and atraumatic. Conjunctivae are normal. No scleral icterus. Cardiovascular: Normal rate, regular rhythm.  Pulmonary/chest: Effort normal and breath sounds normal. No wheezing, rales or rhonchi. Abdominal: Soft, nondistended, nontender. Bowel sounds active throughout. There are no masses palpable. No hepatomegaly. Extremities: No edema Neurological: Alert and oriented to person place and time. Skin: Skin is warm and dry. No rashes noted. Psychiatric: Normal mood and affect. Behavior is normal.  Labs 12/2021: CBC and CMP unremarkable  Labs 04/2022: CBC, ferritin/iron, and TSH nml.   ASSESSMENT AND PLAN:  Family history of advanced polyps Patient brings a letter from her father's gastroenterologist that states that the patient needs to start colon cancer screening at age 89 due to her father's history of presumed advanced adenomatous polyps. I went over the colonoscopy procedure in detail with the patient, and she is agreeable to proceeding. - Colonoscopy [facility]  Eulah Pont, MD

## 2022-09-16 ENCOUNTER — Encounter: Payer: Self-pay | Admitting: Internal Medicine

## 2022-09-19 ENCOUNTER — Other Ambulatory Visit: Payer: Self-pay

## 2022-09-19 ENCOUNTER — Telehealth: Payer: Self-pay | Admitting: Internal Medicine

## 2022-09-19 NOTE — Telephone Encounter (Signed)
Patient tested positive for Covid 09/13/22. She has remained afebrile the duration of having COVID. She did not take any treatment. Feels mostly like she has "a head cold." Her procedure is scheduled for Friday. Is she still okay to have this?

## 2022-09-19 NOTE — Telephone Encounter (Signed)
Inbound call from patient stating she has tested positive for Covid and would like to speak with a nurse. Please give a call to further advise.  Thank you

## 2022-09-19 NOTE — Telephone Encounter (Signed)
Spoke with the patient and moved the colonoscopy to 09/30/22 at 3pm. New instructions for her will be available to her in her MY Chart under letters. She knows she can call and ask to speak with me for any questions or concerns.

## 2022-09-23 ENCOUNTER — Encounter: Payer: 59 | Admitting: Internal Medicine

## 2022-09-30 ENCOUNTER — Encounter: Payer: Self-pay | Admitting: Internal Medicine

## 2022-09-30 ENCOUNTER — Ambulatory Visit (AMBULATORY_SURGERY_CENTER): Payer: 59 | Admitting: Internal Medicine

## 2022-09-30 VITALS — BP 119/77 | HR 68 | Temp 97.8°F | Resp 14 | Ht 68.0 in | Wt 224.0 lb

## 2022-09-30 DIAGNOSIS — Z1211 Encounter for screening for malignant neoplasm of colon: Secondary | ICD-10-CM | POA: Diagnosis not present

## 2022-09-30 DIAGNOSIS — Z83719 Family history of colon polyps, unspecified: Secondary | ICD-10-CM | POA: Diagnosis not present

## 2022-09-30 MED ORDER — SODIUM CHLORIDE 0.9 % IV SOLN: 500.0000 mL | Freq: Once | INTRAVENOUS | Status: DC

## 2022-09-30 NOTE — Progress Notes (Signed)
Pt resting comfortably. VSS. Airway intact. SBAR complete to RN. All questions answered.   

## 2022-09-30 NOTE — Op Note (Signed)
[facility] Patient Name: Tracey Garcia Procedure Date: 09/30/2022 3:04 PM MRN: [id number] Endoscopist: Madelyn Brunner C[place]ay , , [id number] Age: 36 Referring MD:  Date of Birth: 23-Nov-1986 Gender: Female Account #: [id number] Procedure:                Colonoscopy Indications:              Colon cancer screening in patient at increased                            risk: Family history of 1st-degree relative with                            colon polyps, This is the patient's first                            colonoscopy Medicines:                Monitored Anesthesia Care Procedure:                Pre-Anesthesia Assessment:                           - Prior to the procedure, a History and Physical                            was performed, and patient medications and                            allergies were reviewed. The patient's tolerance of                            previous anesthesia was also reviewed. The risks                            and benefits of the procedure and the sedation                            options and risks were discussed with the patient.                            All questions were answered, and informed consent                            was obtained. Prior Anticoagulants: The patient has                            taken no anticoagulant or antiplatelet agents. ASA                            Grade Assessment: II - A patient with mild systemic                            disease. After reviewing the risks and benefits,  the patient was deemed in satisfactory condition to                            undergo the procedure.                           After obtaining informed consent, the colonoscope                            was passed under direct vision. Throughout the                            procedure, the patient's blood pressure, pulse, and                            oxygen saturations were monitored continuously. The                             Olympus CF-HQ190L ([id number]) Colonoscope was                            introduced through the anus and advanced to the the                            terminal ileum. The colonoscopy was performed                            without difficulty. The patient tolerated the                            procedure well. The quality of the bowel                            preparation was good. The terminal ileum, ileocecal                            valve, appendiceal orifice, and rectum were                            photographed. Scope In: 3:09:58 PM Scope Out: 3:28:09 PM Scope Withdrawal Time: 0 hours 11 minutes 53 seconds  Total Procedure Duration: 0 hours 18 minutes 11 seconds  Findings:                 The terminal ileum appeared normal.                           Non-bleeding internal hemorrhoids were found during                            retroflexion.                           The exam was otherwise without abnormality. Complications:            No immediate complications. Estimated Blood Loss:     Estimated  blood loss: none. Impression:               - The examined portion of the ileum was normal.                           - Non-bleeding internal hemorrhoids.                           - The examination was otherwise normal.                           - No specimens collected. Recommendation:           - Discharge patient to home (with escort).                           - Repeat colonoscopy in 5 years for screening                            purposes.                           - The findings and recommendations were discussed                            with the patient. Dr Georgian Co "[name]" Lorenso Courier,  09/30/2022 3:30:52 PM

## 2022-09-30 NOTE — Patient Instructions (Addendum)
-   Discharge patient to home (with escort). - Repeat colonoscopy in 5 years for screening purposes. - The findings and recommendations were discussed with the patient. HANDOUT ON HEMORRHOIDS GIVEN.   YOU HAD AN ENDOSCOPIC PROCEDURE TODAY AT [facility]:   Refer to the procedure report that was given to you for any specific questions about what was found during the examination.  If the procedure report does not answer your questions, please call your gastroenterologist to clarify.  If you requested that your care partner not be given the details of your procedure findings, then the procedure report has been included in a sealed envelope for you to review at your convenience later.  YOU SHOULD EXPECT: Some feelings of bloating in the abdomen. Passage of more gas than usual.  Walking can help get rid of the air that was put into your GI tract during the procedure and reduce the bloating. If you had a lower endoscopy (such as a colonoscopy or flexible sigmoidoscopy) you may notice spotting of blood in your stool or on the toilet paper. If you underwent a bowel prep for your procedure, you may not have a normal bowel movement for a few days.  Please Note:  You might notice some irritation and congestion in your nose or some drainage.  This is from the oxygen used during your procedure.  There is no need for concern and it should clear up in a day or so.  SYMPTOMS TO REPORT IMMEDIATELY:  Following lower endoscopy (colonoscopy or flexible sigmoidoscopy):  Excessive amounts of blood in the stool  Significant tenderness or worsening of abdominal pains  Swelling of the abdomen that is new, acute  Fever of 100F or higher  For urgent or emergent issues, a gastroenterologist can be reached at any hour by calling [phone]. Do not use MyChart messaging for urgent concerns.    DIET:  We do recommend a small meal at first, but then you may proceed to your regular diet.  Drink plenty of  fluids but you should avoid alcoholic beverages for 24 hours.  ACTIVITY:  You should plan to take it easy for the rest of today and you should NOT DRIVE or use heavy machinery until tomorrow (because of the sedation medicines used during the test).    FOLLOW UP: Our staff will call the number listed on your records the next business day following your procedure.  We will call around 7:15- 8:00 am to check on you and address any questions or concerns that you may have regarding the information given to you following your procedure. If we do not reach you, we will leave a message.     If any biopsies were taken you will be contacted by phone or by letter within the next 1-3 weeks.  Please call us at [phone] if you have not heard about the biopsies in 3 weeks.    SIGNATURES/CONFIDENTIALITY: You and/or your care partner have signed paperwork which will be entered into your electronic medical record.  These signatures attest to the fact that that the information above on your After Visit Summary has been reviewed and is understood.  Full responsibility of the confidentiality of this discharge information lies with you and/or your care-partner.

## 2022-09-30 NOTE — Progress Notes (Unsigned)
GASTROENTEROLOGY PROCEDURE H&P NOTE   Primary Care Physician: Tower, Audrie Gallus, MD    Reason for Procedure:   Family history of colon polyps  Plan:    Colonoscopy  Patient is appropriate for endoscopic procedure(s) in the ambulatory ([facility]) setting.  The nature of the procedure, as well as the risks, benefits, and alternatives were carefully and thoroughly reviewed with the patient. Ample time for discussion and questions allowed. The patient understood, was satisfied, and agreed to proceed.     HPI: Tracey Garcia is a 36 y.o. female who presents for colonoscopy for evaluation of family history of colon polyps .  Patient was most recently seen in the Gastroenterology Clinic on 08/05/22.  No interval change in medical history since that appointment. Please refer to that note for full details regarding GI history and clinical presentation.   Past Medical History:  Diagnosis Date   Anxiety    Anxiety    History of IBS    History of seasonal allergies    Thyroid disease     Past Surgical History:  Procedure Laterality Date   UMBILICAL HERNIA REPAIR Bilateral 01/2005    Prior to Admission medications   Medication Sig Start Date End Date Taking? Authorizing Provider  cetirizine (ZYRTEC) 10 MG tablet Take 10 mg by mouth daily.    [provider]  fluticasone (FLONASE) 50 MCG/ACT nasal spray Place 2 sprays into both nostrils daily as needed for allergies or rhinitis.    [provider]  levothyroxine (SYNTHROID) 25 MCG tablet Take 1 tablet (25 mcg total) by mouth daily. 04/13/22   Tower, Audrie Gallus, MD    Current Outpatient Medications  Medication Sig Dispense Refill   fluticasone (FLONASE) 50 MCG/ACT nasal spray Place 2 sprays into both nostrils daily as needed for allergies or rhinitis.     levothyroxine (SYNTHROID) 25 MCG tablet Take 1 tablet (25 mcg total) by mouth daily. 90 tablet 2   cetirizine (ZYRTEC) 10 MG tablet Take 10 mg by mouth daily.     Current  Facility-Administered Medications  Medication Dose Route Frequency Provider Last Rate Last Admin   0.9 %  sodium chloride infusion  500 mL Intravenous Once Imogene Burn, MD        Allergies as of 09/30/2022 - Review Complete 09/30/2022  Allergen Reaction Noted   Chlorine  08/23/2013    Family History  Problem Relation Age of Onset   Cancer Mother    Celiac disease Mother    Lung cancer Mother    Other Mother        AV node Tachycardia   High Cholesterol Father    Syncope episode Father        micturition syncope   Colon polyps Father    Arthritis Maternal Grandmother    High blood pressure Maternal Grandmother    Sjogren's syndrome Maternal Grandmother    Heart disease Maternal Grandfather    Diabetes Maternal Grandfather    High blood pressure Paternal Grandmother    Heart attack Paternal Grandfather    Colon cancer Other        great grandmother   Stomach cancer Neg Hx    Esophageal cancer Neg Hx     Social History   Socioeconomic History   Marital status: Married    Spouse name: Not on file   Number of children: 0   Years of education: Not on file   Highest education level: Not on file  Occupational History   Occupation: Medical  billing  Tobacco Use   Smoking status: Never   Smokeless tobacco: Never  Vaping Use   Vaping Use: Never used  Substance and Sexual Activity   Alcohol use: Yes    Alcohol/week: 0.0 standard drinks of alcohol    Comment: socially   Drug use: No   Sexual activity: Not on file  Other Topics Concern   Not on file  Social History Narrative   Not on file   Social Determinants of Health   Financial Resource Strain: Not on file  Food Insecurity: Not on file  Transportation Needs: Not on file  Physical Activity: Not on file  Stress: Not on file  Social Connections: Not on file  Intimate Partner Violence: Not on file    Physical Exam: Vital signs in last 24 hours: BP (!) 140/114   Pulse 96   Temp 97.8 F (36.6 C)   Ht 5'  8" (1.727 m)   Wt 224 lb (101.6 kg)   SpO2 100%   BMI 34.06 kg/m  GEN: NAD EYE: Sclerae anicteric ENT: MMM CV: Non-tachycardic Pulm: No increased WOB GI: Soft NEURO:  Alert & Oriented   [name], MD [facility]   09/30/2022 2:48 PM

## 2022-10-03 ENCOUNTER — Telehealth: Payer: Self-pay

## 2022-10-03 NOTE — Telephone Encounter (Signed)
Attempted to reach patient for post-procedure f/u call. No answer. Left message for her to please not hesitate to call if she has any questions/concerns regarding her care. 

## 2022-10-31 ENCOUNTER — Encounter: Payer: Self-pay | Admitting: Family Medicine

## 2022-10-31 ENCOUNTER — Telehealth (INDEPENDENT_AMBULATORY_CARE_PROVIDER_SITE_OTHER): Payer: 59 | Admitting: Family Medicine

## 2022-10-31 DIAGNOSIS — R5382 Chronic fatigue, unspecified: Secondary | ICD-10-CM | POA: Diagnosis not present

## 2022-10-31 DIAGNOSIS — R0683 Snoring: Secondary | ICD-10-CM | POA: Diagnosis not present

## 2022-10-31 NOTE — Assessment & Plan Note (Signed)
Daily snoring  Un refreshed after even 8 h of sleep  Some day time fatigue  Family h/o sleep apnea   Pt is interested in eval for poss sleep apnea  Will ref to pulm for a consult   Enc her to keep working on wt loss/healthy diet and exercise

## 2022-10-31 NOTE — Progress Notes (Signed)
Virtual Visit via Video Note  I connected with Bartholome Bill on 10/31/22 at  3:00 PM EST by a video enabled telemedicine application and verified that I am speaking with the correct person using two identifiers.  Location: Patient: home Provider: office    I discussed the limitations of evaluation and management by telemedicine and the availability of in person appointments. The patient expressed understanding and agreed to proceed.  Parties involved in encounter  Patient: Tracey Garcia   Provider:  Roxy Manns MD   History of Present Illness:  Pt presents to discuss fatigue   We discussed this in may  She stopped taking zyrtec which helped for a while   A lot going on   Trying to prioritize sleep and rest   Worried about sleep apnea Both parents have it   Wants to get tested   In the evening she nodes off easily  Not driving   More weary than drowsy  Snoring- husband notes significant  No witnessed apnea   Wakes up with dry mouth  Throat is sometimes sore   Tends to have nasal congestion also  Uses flonase   No acid reflux symptoms   Usually goes to sleep easily  Occ has nightmares and cannot get back to sleep  Does not wake up refresed   7-8 hours of sleep per night  Needs 8-9     Exercise- is doing better and moving more  Gets outdoors also  Better diet also     Hypothyroid Lab Results  Component Value Date   TSH 1.64 04/11/2022   Fatigue labs Lab Results  Component Value Date   WBC 6.0 04/11/2022   HGB 14.2 04/11/2022   HCT 41.2 04/11/2022   MCV 93.1 04/11/2022   PLT 251.0 04/11/2022   Lab Results  Component Value Date   IRON 86 04/11/2022   FERRITIN 47.0 04/11/2022   Patient Active Problem List   Diagnosis Date Noted   Snoring 10/31/2022   Fatigue 04/11/2022   Hypothyroid 02/07/2022   Elevated TSH 12/08/2021   Right knee pain 08/30/2017   Family history of colonic polyps 08/23/2017   Recurrent sinusitis 10/23/2015    Routine general medical examination at a health care facility 10/20/2014   IBS (irritable bowel syndrome) 11/04/2013   Generalized anxiety disorder 11/04/2013   Past Medical History:  Diagnosis Date   Allergy    Anxiety    Anxiety    History of IBS    History of seasonal allergies    Thyroid disease    Past Surgical History:  Procedure Laterality Date   UMBILICAL HERNIA REPAIR Bilateral 01/2005   Social History   Tobacco Use   Smoking status: Never   Smokeless tobacco: Never  Vaping Use   Vaping Use: Never used  Substance Use Topics   Alcohol use: Yes    Alcohol/week: 0.0 standard drinks of alcohol    Comment: socially   Drug use: No   Family History  Problem Relation Age of Onset   Cancer Mother    Celiac disease Mother    Lung cancer Mother    Other Mother        AV node Tachycardia   High Cholesterol Father    Syncope episode Father        micturition syncope   Colon polyps Father    Arthritis Maternal Grandmother    High blood pressure Maternal Grandmother    Sjogren's syndrome Maternal Grandmother    Heart disease Maternal Grandfather  Diabetes Maternal Grandfather    High blood pressure Paternal Grandmother    Heart attack Paternal Grandfather    Colon cancer Other        great grandmother   Stomach cancer Neg Hx    Esophageal cancer Neg Hx    Allergies  Allergen Reactions   Chlorine     Trouble breathing    Current Outpatient Medications on File Prior to Visit  Medication Sig Dispense Refill   fluticasone (FLONASE) 50 MCG/ACT nasal spray Place 2 sprays into both nostrils daily as needed for allergies or rhinitis.     levothyroxine (SYNTHROID) 25 MCG tablet Take 1 tablet (25 mcg total) by mouth daily. 90 tablet 2   loratadine (CLARITIN) 10 MG tablet      No current facility-administered medications on file prior to visit.   Review of Systems  Constitutional:  Positive for malaise/fatigue. Negative for chills and fever.  HENT:  Positive for  sore throat. Negative for congestion, ear pain and sinus pain.   Eyes:  Negative for blurred vision, discharge and redness.  Respiratory:  Negative for cough, shortness of breath and stridor.   Cardiovascular:  Negative for chest pain, palpitations and leg swelling.  Gastrointestinal:  Negative for abdominal pain, diarrhea, nausea and vomiting.  Musculoskeletal:  Negative for myalgias.  Skin:  Negative for rash.  Neurological:  Negative for dizziness and headaches.       Observations/Objective:  Patient appears well, in no distress Weight is baseline  No facial swelling or asymmetry Normal voice-not hoarse and no slurred speech No obvious tremor or mobility impairment Moving neck and UEs normally Able to hear the call well  No cough or shortness of breath during interview  Talkative and mentally sharp with no cognitive changes No skin changes on face or neck , no rash or pallor Affect is normal   Assessment and Plan: Problem List Items Addressed This Visit       Other   Fatigue    Some day time somnolence  Improved a bit off zyrtec but still significant   Interested in eval for sleep apnea      Snoring - Primary    Daily snoring  Un refreshed after even 8 h of sleep  Some day time fatigue  Family h/o sleep apnea   Pt is interested in eval for poss sleep apnea  Will ref to pulm for a consult   Enc her to keep working on wt loss/healthy diet and exercise         Follow Up Instructions: Keep working on healthy diet and exercise  Get outdoors when you can   I will place an order for pulmonary consult to discuss sleep issues   Let us know if you don't hear back in 1-2 weeks    I discussed the assessment and treatment plan with the patient. The patient was provided an opportunity to ask questions and all were answered. The patient agreed with the plan and demonstrated an understanding of the instructions.   The patient was advised to call back or seek an  in-person evaluation if the symptoms worsen or if the condition fails to improve as anticipated.     Roxy Manns, MD

## 2022-10-31 NOTE — Assessment & Plan Note (Signed)
Some day time somnolence  Improved a bit off zyrtec but still significant   Interested in eval for sleep apnea

## 2022-10-31 NOTE — Patient Instructions (Signed)
Keep working on [employer] and exercise  Get outdoors when you can   I will place an order for pulmonary consult to discuss sleep issues   Let us know if you don't hear back in 1-2 weeks

## 2022-11-09 ENCOUNTER — Encounter: Payer: Self-pay | Admitting: Primary Care

## 2022-11-09 ENCOUNTER — Ambulatory Visit (INDEPENDENT_AMBULATORY_CARE_PROVIDER_SITE_OTHER): Payer: 59 | Admitting: Primary Care

## 2022-11-09 VITALS — BP 126/84 | HR 81 | Temp 98.0°F | Ht 68.0 in | Wt 225.2 lb

## 2022-11-09 DIAGNOSIS — R0683 Snoring: Secondary | ICD-10-CM | POA: Diagnosis not present

## 2022-11-09 NOTE — Progress Notes (Signed)
@Patient  ID: , female    DOB: 01-Sep-1986, 36 y.o.   MRN: [id number]  Chief Complaint  Patient presents with   Consult    Snoring and daytime fatigue x 1 year    Referring provider: Tower, [id number], MD  HPI: 36 year old female.  Past medical history significant for hypothyroidism, generalized anxiety disorder, IBS, recurrent sinusitis.  11/09/2022 Patient presents today for sleep consult. She has symptoms of chronic fatigue with associated snoring. Symptoms have been persistent for the last year. She has not woke herself up choking/gasping for air. Both her parents have sleep apnea, mother has central apneas. No previous sleep studies. Typical bedtime is between 10 and 11 PM.  It does not take her long to fall asleep.  She wakes up once at night.  She starts her day between 530 and 6:30 AM.  Her weight is up over the last couple of years. She has chronic nasal congestion, uses fluticasone nasal spray at bedtime. No symptoms of narcolepsy or cataplexy.   Sleep questionnaire Symptoms-  Snoring   Prior sleep study- None  Bedtime- 10-11pm Time to fall asleep- 10-15 mins Nocturnal awakenings- once  Out of bed/start of day- 5:30-6:30am Weight changes- increased  Do you operate heavy machinery- No Do you currently wear CPAP- No Do you current wear oxygen- No Epworth- 10  Allergies  Allergen Reactions   Chlorine     Trouble breathing     Immunization History  Administered Date(s) Administered   Hepatitis A, Adult 06/05/2018, 11/20/2019   Influenza,inj,Quad PF,6+ Mos 12/08/2021   Influenza-Unspecified 09/18/2020   Moderna Sars-Covid-2 Vaccination 12/17/2019, 01/14/2020, 10/28/2020   PPD Test 08/12/2016   Tdap 12/19/2014    Past Medical History:  Diagnosis Date   Allergy    Anxiety    Anxiety    History of IBS    History of seasonal allergies    Thyroid disease     Tobacco History: Social History   Tobacco Use  Smoking Status Never  Smokeless Tobacco  Never   Counseling given: Not Answered   Outpatient Medications Prior to Visit  Medication Sig Dispense Refill   fluticasone (FLONASE) 50 MCG/ACT nasal spray Place 2 sprays into both nostrils daily as needed for allergies or rhinitis.     levothyroxine (SYNTHROID) 25 MCG tablet Take 1 tablet (25 mcg total) by mouth daily. 90 tablet 2   loratadine (CLARITIN) 10 MG tablet      No facility-administered medications prior to visit.   Review of Systems  Review of Systems  Constitutional:  Positive for fatigue.  HENT: Negative.    Respiratory: Negative.    Cardiovascular: Negative.   Psychiatric/Behavioral:  Positive for sleep disturbance.    Physical Exam  BP 126/84 (BP Location: Left Arm, Patient Position: Sitting, Cuff Size: Normal)   Pulse 81   Temp 98 F (36.7 C) (Oral)   Ht 5\' 8"  (1.727 m)   Wt 225 lb 3.2 oz (102.2 kg)   SpO2 100%   BMI 34.24 kg/m  Physical Exam Constitutional:      Appearance: Normal appearance.  HENT:     Mouth/Throat:     Mouth: Mucous membranes are moist.     Pharynx: Oropharynx is clear.     Comments: Mallampati class I Cardiovascular:     Rate and Rhythm: Normal rate and regular rhythm.  Pulmonary:     Effort: Pulmonary effort is normal.     Breath sounds: Normal breath sounds.  Skin:    General: Skin  is warm and dry.  Neurological:     General: No focal deficit present.     Mental Status: She is alert and oriented to person, place, and time. Mental status is at baseline.  Psychiatric:        Mood and Affect: Mood normal.        Behavior: Behavior normal.        Thought Content: Thought content normal.        Judgment: Judgment normal.      Lab Results:  CBC    Component Value Date/Time   WBC 6.0 04/11/2022 1456   RBC 4.43 04/11/2022 1456   HGB 14.2 04/11/2022 1456   HCT 41.2 04/11/2022 1456   PLT 251.0 04/11/2022 1456   MCV 93.1 04/11/2022 1456   MCHC 34.5 04/11/2022 1456   RDW 13.0 04/11/2022 1456   LYMPHSABS 1.4  04/11/2022 1456   MONOABS 0.5 04/11/2022 1456   EOSABS 0.1 04/11/2022 1456   BASOSABS 0.0 04/11/2022 1456    BMET    Component Value Date/Time   NA 137 12/07/2021 0804   K 4.3 12/07/2021 0804   CL 102 12/07/2021 0804   CO2 26 12/07/2021 0804   GLUCOSE 86 12/07/2021 0804   BUN 12 12/07/2021 0804   CREATININE 0.81 12/07/2021 0804   CALCIUM 9.2 12/07/2021 0804    BNP No results found for: "BNP"  ProBNP No results found for: "PROBNP"  Imaging: No results found.   Assessment & Plan:   Snoring - Patient has symptoms of daytime fatigue, nonrestorative sleep and snoring. Family hx sleep apnea. Epworth score is 10.  BMI 34.  Low to moderate suspicion patient may have underlying sleep apnea, needs home sleep study to evaluate.  Reviewed risks of untreated sleep apnea including cardiac arrhythmias, pulmonary hypertension, stroke or diabetes.  We also discussed treatment options including weight loss, oral appliance, CPAP therapy or referral to ENT for possible surgical options.  If home sleep study is negative patient to follow-up with PCP for further fatigue workup.  Encouraged weight loss efforts as able.  Advised against driving if experiencing excessive daytime sleepiness or fatigue.  Follow-up 1 to 2 weeks after sleep study to review results and treatment options if needed.   Glenford Bayley, NP 11/09/2022

## 2022-11-09 NOTE — Assessment & Plan Note (Addendum)
-   Patient has symptoms of daytime fatigue, nonrestorative sleep and snoring. Family hx sleep apnea. Epworth score is 10.  BMI 34.  Low to moderate suspicion patient may have underlying sleep apnea, needs home sleep study to evaluate.  Reviewed risks of untreated sleep apnea including cardiac arrhythmias, pulmonary hypertension, stroke or diabetes.  We also discussed treatment options including weight loss, oral appliance, CPAP therapy or referral to ENT for possible surgical options.  If home sleep study is negative patient to follow-up with PCP for further fatigue workup.  Encouraged weight loss efforts as able.  Advised against driving if experiencing excessive daytime sleepiness or fatigue.  Follow-up 1 to 2 weeks after sleep study to review results and treatment options if needed.

## 2022-11-09 NOTE — Patient Instructions (Addendum)
Sleep apnea is defined as period of 10 seconds or longer when you stop breathing at night. This can happen multiple times a night. Dx sleep apnea is when this occurs more than 5 times an hour.   Sleep apnea can be due to obstructive or central causes. Obstructive sleep apnea occurs when tongue falls back and obstructs the back of your throat causing you to stop breathing. Central apneas are caused by improper signals from the brain that control breathing muscles, some  cause of central sleep apnea are certain heart issues or medications   Mild OSA 5-15 apneic events an hour Moderate OSA 15-30 apneic events an hour Severe OSA > 30 apneic events an hour   Untreated sleep apnea puts you at higher risk for cardiac arrhythmias, pulmonary HTN, stroke and diabetes   Treatment options include weight loss, side sleeping position, oral appliance, CPAP therapy or referral to ENT for possible surgical options    Recommendations: Focus on side sleeping position or elevate head with wedge pillow 30 degrees Work on weight loss efforts if able  Do not drive if experiencing excessive daytime sleepiness of fatigue    Orders: Home sleep study re: loud snoring    Follow-up: Please call to schedule follow-up 1-2 weeks after completing home sleep study to review results and treatment if needed (can be virtual)  Sleep Apnea Sleep apnea is a condition in which breathing pauses or becomes shallow during sleep. People with sleep apnea usually snore loudly. They may have times when they gasp and stop breathing for 10 seconds or more during sleep. This may happen many times during the night. Sleep apnea disrupts your sleep and keeps your body from getting the rest that it needs. This condition can increase your risk of certain health problems, including: Heart attack. Stroke. Obesity. Type 2 diabetes. Heart failure. Irregular heartbeat. High blood pressure. The goal of treatment is to help you breathe  normally again. What are the causes?  The most common cause of sleep apnea is a collapsed or blocked airway. There are three kinds of sleep apnea: Obstructive sleep apnea. This kind is caused by a blocked or collapsed airway. Central sleep apnea. This kind happens when the part of the brain that controls breathing does not send the correct signals to the muscles that control breathing. Mixed sleep apnea. This is a combination of obstructive and central sleep apnea. What increases the risk? You are more likely to develop this condition if you: Are overweight. Smoke. Have a smaller than normal airway. Are older. Are female. Drink alcohol. Take sedatives or tranquilizers. Have a family history of sleep apnea. Have a tongue or tonsils that are larger than normal. What are the signs or symptoms? Symptoms of this condition include: Trouble staying asleep. Loud snoring. Morning headaches. Waking up gasping. Dry mouth or sore throat in the morning. Daytime sleepiness and tiredness. If you have daytime fatigue because of sleep apnea, you may be more likely to have: Trouble concentrating. Forgetfulness. Irritability or mood swings. Personality changes. Feelings of depression. Sexual dysfunction. This may include loss of interest if you are female, or erectile dysfunction if you are female. How is this diagnosed? This condition may be diagnosed with: A medical history. A physical exam. A series of tests that are done while you are sleeping (sleep study). These tests are usually done in a sleep lab, but they may also be done at home. How is this treated? Treatment for this condition aims to restore normal breathing  and to ease symptoms during sleep. It may involve managing health issues that can affect breathing, such as high blood pressure or obesity. Treatment may include: Sleeping on your side. Using a decongestant if you have nasal congestion. Avoiding the use of depressants, including  alcohol, sedatives, and narcotics. Losing weight if you are overweight. Making changes to your diet. Quitting smoking. Using a device to open your airway while you sleep, such as: An oral appliance. This is a custom-made mouthpiece that shifts your lower jaw forward. A continuous positive airway pressure (CPAP) device. This device blows air through a mask when you breathe out (exhale). A nasal expiratory positive airway pressure (EPAP) device. This device has valves that you put into each nostril. A bi-level positive airway pressure (BIPAP) device. This device blows air through a mask when you breathe in (inhale) and breathe out (exhale). Having surgery if other treatments do not work. During surgery, excess tissue is removed to create a wider airway. Follow these instructions at home: Lifestyle Make any lifestyle changes that your health care provider recommends. Eat a healthy, well-balanced diet. Take steps to lose weight if you are overweight. Avoid using depressants, including alcohol, sedatives, and narcotics. Do not use any products that contain nicotine or tobacco. These products include cigarettes, chewing tobacco, and vaping devices, such as e-cigarettes. If you need help quitting, ask your health care provider. General instructions Take over-the-counter and prescription medicines only as told by your health care provider. If you were given a device to open your airway while you sleep, use it only as told by your health care provider. If you are having surgery, make sure to tell your health care provider you have sleep apnea. You may need to bring your device with you. Keep all follow-up visits. This is important. Contact a health care provider if: The device that you received to open your airway during sleep is uncomfortable or does not seem to be working. Your symptoms do not improve. Your symptoms get worse. Get help right away if: You develop: Chest pain. Shortness of  breath. Discomfort in your back, arms, or stomach. You have: Trouble speaking. Weakness on one side of your body. Drooping in your face. These symptoms may represent a serious problem that is an emergency. Do not wait to see if the symptoms will go away. Get medical help right away. Call your local emergency services (911 in the [place] Do not drive yourself to the hospital. Summary Sleep apnea is a condition in which breathing pauses or becomes shallow during sleep. The most common cause is a collapsed or blocked airway. The goal of treatment is to restore normal breathing and to ease symptoms during sleep. This information is not intended to replace advice given to you by your health care provider. Make sure you discuss any questions you have with your health care provider. Document Revised: 06/30/2021 Document Reviewed: 10/30/2020 Elsevier Patient Education  [place].

## 2022-11-14 NOTE — Progress Notes (Signed)
Reviewed and agree with assessment/plan.   Marq Rebello, MD [facility] 11/14/2022, 7:04 AM Pager:  [phone]  

## 2022-12-05 ENCOUNTER — Telehealth: Payer: Self-pay | Admitting: Family Medicine

## 2022-12-05 DIAGNOSIS — Z0000 Encounter for general adult medical examination without abnormal findings: Secondary | ICD-10-CM

## 2022-12-05 DIAGNOSIS — E039 Hypothyroidism, unspecified: Secondary | ICD-10-CM

## 2022-12-05 NOTE — Telephone Encounter (Signed)
-----   Message from Velna Hatchet, RT sent at 11/29/2022 11:27 AM EST ----- Regarding: Wed 1/3 lab Patient is scheduled for cpx, please order future labs.  Thanks, Anda Kraft

## 2022-12-07 ENCOUNTER — Other Ambulatory Visit: Payer: 59

## 2022-12-07 ENCOUNTER — Other Ambulatory Visit (INDEPENDENT_AMBULATORY_CARE_PROVIDER_SITE_OTHER): Payer: 59

## 2022-12-07 DIAGNOSIS — E039 Hypothyroidism, unspecified: Secondary | ICD-10-CM | POA: Diagnosis not present

## 2022-12-07 DIAGNOSIS — Z0000 Encounter for general adult medical examination without abnormal findings: Secondary | ICD-10-CM

## 2022-12-07 LAB — CBC WITH DIFFERENTIAL/PLATELET
Basophils Absolute: 0 10*3/uL (ref 0.0–0.1)
Basophils Relative: 0.8 % (ref 0.0–3.0)
Eosinophils Absolute: 0.1 10*3/uL (ref 0.0–0.7)
Eosinophils Relative: 3 % (ref 0.0–5.0)
HCT: 44 % (ref 36.0–46.0)
Hemoglobin: 15.1 g/dL — ABNORMAL HIGH (ref 12.0–15.0)
Lymphocytes Relative: 33.1 % (ref 12.0–46.0)
Lymphs Abs: 1.3 10*3/uL (ref 0.7–4.0)
MCHC: 34.3 g/dL (ref 30.0–36.0)
MCV: 93.4 fl (ref 78.0–100.0)
Monocytes Absolute: 0.4 10*3/uL (ref 0.1–1.0)
Monocytes Relative: 9.3 % (ref 3.0–12.0)
Neutro Abs: 2.1 10*3/uL (ref 1.4–7.7)
Neutrophils Relative %: 53.8 % (ref 43.0–77.0)
Platelets: 278 10*3/uL (ref 150.0–400.0)
RBC: 4.71 Mil/uL (ref 3.87–5.11)
RDW: 12.6 % (ref 11.5–15.5)
WBC: 3.9 10*3/uL — ABNORMAL LOW (ref 4.0–10.5)

## 2022-12-07 LAB — COMPREHENSIVE METABOLIC PANEL
ALT: 14 U/L (ref 0–35)
AST: 16 U/L (ref 0–37)
Albumin: 4.2 g/dL (ref 3.5–5.2)
Alkaline Phosphatase: 64 U/L (ref 39–117)
BUN: 11 mg/dL (ref 6–23)
CO2: 27 mEq/L (ref 19–32)
Calcium: 9.3 mg/dL (ref 8.4–10.5)
Chloride: 102 mEq/L (ref 96–112)
Creatinine, Ser: 0.74 mg/dL (ref 0.40–1.20)
GFR: 104.66 mL/min (ref >60.00)
Glucose, Bld: 87 mg/dL (ref 70–99)
Potassium: 4.3 mEq/L (ref 3.5–5.1)
Sodium: 138 mEq/L (ref 135–145)
Total Bilirubin: 0.7 mg/dL (ref 0.2–1.2)
Total Protein: 6.7 g/dL (ref 6.0–8.3)

## 2022-12-07 LAB — LIPID PANEL
Cholesterol: 199 mg/dL (ref 0–200)
HDL: 74.5 mg/dL (ref >39.00)
LDL Cholesterol: 111 mg/dL — ABNORMAL HIGH (ref 0–99)
NonHDL: 124.78
Total CHOL/HDL Ratio: 3
Triglycerides: 70 mg/dL (ref 0.0–149.0)
VLDL: 14 mg/dL (ref 0.0–40.0)

## 2022-12-07 LAB — TSH: TSH: 4.64 u[IU]/mL (ref 0.35–5.50)

## 2022-12-12 ENCOUNTER — Ambulatory Visit (INDEPENDENT_AMBULATORY_CARE_PROVIDER_SITE_OTHER): Payer: 59 | Admitting: Family Medicine

## 2022-12-12 ENCOUNTER — Encounter: Payer: Self-pay | Admitting: Family Medicine

## 2022-12-12 VITALS — BP 118/72 | HR 85 | Temp 97.2°F | Ht 68.5 in | Wt 223.5 lb

## 2022-12-12 DIAGNOSIS — E039 Hypothyroidism, unspecified: Secondary | ICD-10-CM

## 2022-12-12 DIAGNOSIS — Z83719 Family history of colon polyps, unspecified: Secondary | ICD-10-CM | POA: Diagnosis not present

## 2022-12-12 DIAGNOSIS — Z0000 Encounter for general adult medical examination without abnormal findings: Secondary | ICD-10-CM | POA: Diagnosis not present

## 2022-12-12 DIAGNOSIS — Z23 Encounter for immunization: Secondary | ICD-10-CM

## 2022-12-12 MED ORDER — LEVOTHYROXINE SODIUM 25 MCG PO TABS: 25.0000 ug | ORAL_TABLET | Freq: Every day | ORAL | 3 refills | Status: DC

## 2022-12-12 NOTE — Patient Instructions (Addendum)
Flu shot today   We will send for pap report from gyn   Make a plan for exercise 5 days per week    Take care of yourself   Labs look good

## 2022-12-12 NOTE — Assessment & Plan Note (Signed)
Hypothyroidism  Pt has no clinical changes No change in energy level/ hair or skin/ edema and no tremor Lab Results  Component Value Date   TSH 4.64 12/07/2022    Plan to continue levothyroxine 25 mcg daily

## 2022-12-12 NOTE — Assessment & Plan Note (Signed)
Had her colonoscopy 09/2022 with 5 y recall

## 2022-12-12 NOTE — Progress Notes (Signed)
Subjective:    Patient ID: Tracey Garcia, female    DOB: 10-17-1986, 37 y.o.   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 07/13/2021  Had visit 07/14/22 Dr Vincente Poli   Colonoscopy 09/2022 with 5 y recall  (family hx of colon polyps)  BP Readings from Last 3 Encounters:  12/12/22 118/72  11/09/22 126/84  09/30/22 119/77   Pulse Readings from Last 3 Encounters:  12/12/22 85  11/09/22 81  09/30/22 68     Hypothyroidism  Pt has no clinical changes No change in energy level/ hair or skin/ edema and no tremor Lab Results  Component Value Date   TSH 4.64 12/07/2022    Levothyroxine 25 mcg daily  Feels the same   Still uses flonase for allergies   Cholesterol Lab Results  Component Value Date   CHOL 199 12/07/2022   CHOL 206 (H) 12/07/2021   CHOL 193 11/16/2020   Lab Results  Component Value Date   HDL 74.50 12/07/2022   HDL 71.50 12/07/2021   HDL 71.00 11/16/2020   Lab Results  Component Value Date   LDLCALC 111 (H) 12/07/2022   LDLCALC 121 (H) 12/07/2021   LDLCALC 109 (H) 11/16/2020   Lab Results  Component Value Date   TRIG 70.0 12/07/2022   TRIG 69.0 12/07/2021   TRIG  65.0 11/16/2020   Lab Results  Component Value Date   CHOLHDL 3 12/07/2022   CHOLHDL 3 12/07/2021   CHOLHDL 3 11/16/2020   No results found for: "LDLDIRECT"   Not too bad  Working on a better diet - hard to be consistent  Is considering mediterranean diet/moderately   Exercise : walks when she can  Has exercise bike  Also u tube video for strength training     Other labs Lab Results  Component Value Date   CREATININE 0.74 12/07/2022   BUN 11 12/07/2022   NA 138 12/07/2022   K 4.3 12/07/2022   CL 102 12/07/2022   CO2 27 12/07/2022   Lab Results  Component Value Date   ALT 14 12/07/2022   AST 16 12/07/2022   ALKPHOS 64 12/07/2022   BILITOT 0.7 12/07/2022   Lab Results  Component Value Date   WBC 3.9 (L) 12/07/2022   HGB 15.1 (H) 12/07/2022   HCT 44.0 12/07/2022   MCV 93.4 12/07/2022   PLT 278.0 12/07/2022    Patient Active Problem List   Diagnosis Date Noted   Snoring 10/31/2022   Fatigue 04/11/2022   Hypothyroid 02/07/2022   Right knee pain 08/30/2017   Family history of colonic polyps 08/23/2017   Recurrent sinusitis 10/23/2015  Routine general medical examination at a health care facility 10/20/2014   IBS (irritable bowel syndrome) 11/04/2013   Generalized anxiety disorder 11/04/2013   Past Medical History:  Diagnosis Date   Allergy    Anxiety    Anxiety    History of IBS    History of seasonal allergies    Thyroid disease    Past Surgical History:  Procedure Laterality Date   UMBILICAL HERNIA REPAIR Bilateral 01/2005   Social History   Tobacco Use   Smoking status: Never   Smokeless tobacco: Never  Vaping Use   Vaping Use: Never used  Substance Use Topics   Alcohol use: Yes    Alcohol/week: 0.0 standard drinks of alcohol    Comment: socially   Drug use: No   Family History  Problem Relation Age of Onset   Cancer Mother    Celiac disease Mother    Lung cancer Mother    Other Mother        AV node Tachycardia   High  Cholesterol Father    Syncope episode Father        micturition syncope   Colon polyps Father    Arthritis Maternal Grandmother    High blood pressure Maternal Grandmother    Sjogren's syndrome Maternal Grandmother    Heart disease Maternal Grandfather    Diabetes Maternal Grandfather    High blood pressure Paternal Grandmother    Heart attack Paternal Grandfather    Colon cancer Other        great grandmother   Stomach cancer Neg Hx    Esophageal cancer Neg Hx    Allergies  Allergen Reactions   Chlorine     Trouble breathing    Current Outpatient Medications on File Prior to Visit  Medication Sig Dispense Refill   fluticasone (FLONASE) 50 MCG/ACT nasal spray Place 2 sprays into both nostrils daily as needed for allergies or rhinitis.     loratadine (CLARITIN) 10 MG tablet      No current facility-administered medications on file prior to visit.     Review of Systems  Constitutional:  Negative for activity change, appetite change, fatigue, fever and unexpected weight change.  HENT:  Negative for congestion, ear pain, rhinorrhea, sinus pressure and sore throat.   Eyes:  Negative for pain, redness and visual disturbance.  Respiratory:  Negative for cough, shortness of breath and wheezing.   Cardiovascular:  Negative for chest pain and palpitations.  Gastrointestinal:  Negative for abdominal pain, blood in stool, constipation and diarrhea.  Endocrine: Negative for polydipsia and polyuria.  Genitourinary:  Negative for dysuria, frequency and urgency.  Musculoskeletal:  Negative for arthralgias, back pain and myalgias.  Skin:  Negative for pallor and rash.  Allergic/Immunologic: Negative for environmental allergies.  Neurological:  Negative for dizziness, syncope and headaches.  Hematological:  Negative for adenopathy. Does not bruise/bleed easily.  Psychiatric/Behavioral:  Negative for decreased concentration and dysphoric mood. The patient is not nervous/anxious.         Objective:   Physical Exam Constitutional:      General: She is not in acute distress.    Appearance: Normal appearance. She is well-developed. She is obese. She is not ill-appearing or diaphoretic.  HENT:     Head: Normocephalic and atraumatic.     Right Ear: Tympanic membrane, ear canal and external ear normal.     Left Ear: Tympanic membrane, ear canal and external ear normal.     Nose: Nose normal. No congestion.  Mouth/Throat:     Mouth: Mucous membranes are moist.     Pharynx: Oropharynx is clear. No posterior oropharyngeal erythema.  Eyes:     General: No scleral icterus.    Extraocular Movements: Extraocular movements intact.     Conjunctiva/sclera: Conjunctivae normal.     Pupils: Pupils are equal, round, and reactive to light.  Neck:     Thyroid: No thyromegaly.     Vascular: No carotid bruit or JVD.  Cardiovascular:     Rate and Rhythm: Normal rate and regular rhythm.     Pulses: Normal pulses.     Heart sounds: Normal heart sounds.     No gallop.  Pulmonary:     Effort: Pulmonary effort is normal. No respiratory distress.     Breath sounds: Normal breath sounds. No wheezing.     Comments: Good air exch Chest:     Chest wall: No tenderness.  Abdominal:     General: Bowel sounds are normal. There is no distension or abdominal bruit.     Palpations: Abdomen is soft. There is no mass.     Tenderness: There is no abdominal tenderness.     Hernia: No hernia is present.  Genitourinary:    Comments: Breast exam: No mass, nodules, thickening, tenderness, bulging, retraction, inflamation, nipple discharge or skin changes noted.  No axillary or clavicular LA.     Musculoskeletal:        General: No tenderness. Normal range of motion.     Cervical back: Normal range of motion and neck supple. No rigidity. No muscular tenderness.     Right lower leg: No edema.     Left lower leg: No edema.     Comments: No kyphosis   Lymphadenopathy:     Cervical: No cervical  adenopathy.  Skin:    General: Skin is warm and dry.     Coloration: Skin is not pale.     Findings: No erythema or rash.     Comments: Solar lentigines diffusely   Neurological:     Mental Status: She is alert. Mental status is at baseline.     Cranial Nerves: No cranial nerve deficit.     Motor: No abnormal muscle tone.     Coordination: Coordination normal.     Gait: Gait normal.     Deep Tendon Reflexes: Reflexes are normal and symmetric. Reflexes normal.  Psychiatric:        Mood and Affect: Mood normal.        Cognition and Memory: Cognition and memory normal.           Assessment & Plan:   Problem List Items Addressed This Visit       Endocrine   Hypothyroid    Hypothyroidism  Pt has no clinical changes No change in energy level/ hair or skin/ edema and no tremor Lab Results  Component Value Date   TSH 4.64 12/07/2022    Plan to continue levothyroxine 25 mcg daily       Relevant Medications   levothyroxine (SYNTHROID) 25 MCG tablet     Other   Family history of colonic polyps    Had her colonoscopy 09/2022 with 5 y recall      Routine general medical examination at a health care facility - Primary    Reviewed health habits including diet and exercise and skin cancer prevention Reviewed appropriate screening tests for age  Also reviewed health mt list, fam hx and immunization status , as well as  social and family history   See HPI Labs reviewed Flu shot given Colonoscopy utd 09/2022 with 5 y recall  Pap 07/2021- sent for report from Dr Doreen Beam more regular exercise       Relevant Orders   Flu Vaccine QUAD 6+ mos PF IM (Fluarix Quad PF) (Completed)   Other Visit Diagnoses     Need for influenza vaccination       Relevant Orders   Flu Vaccine QUAD 6+ mos PF IM (Fluarix Quad PF) (Completed)

## 2022-12-12 NOTE — Assessment & Plan Note (Signed)
Reviewed health habits including diet and exercise and skin cancer prevention Reviewed appropriate screening tests for age  Also reviewed health mt list, fam hx and immunization status , as well as social and family history   See HPI Labs reviewed Flu shot given Colonoscopy utd 09/2022 with 5 y recall  Pap 07/2021- sent for report from Dr Doreen Beam more regular exercise

## 2022-12-13 ENCOUNTER — Ambulatory Visit: Payer: 59 | Admitting: Primary Care

## 2022-12-13 DIAGNOSIS — R0683 Snoring: Secondary | ICD-10-CM | POA: Diagnosis not present

## 2022-12-16 DIAGNOSIS — R0683 Snoring: Secondary | ICD-10-CM | POA: Diagnosis not present

## 2022-12-19 ENCOUNTER — Telehealth: Payer: Self-pay

## 2022-12-19 DIAGNOSIS — R0683 Snoring: Secondary | ICD-10-CM

## 2022-12-19 NOTE — Telephone Encounter (Signed)
LMOM for Pt to call back for results of sleep study.

## 2022-12-19 NOTE — Progress Notes (Signed)
Sleep study was non-diagnostic for sleep apnea. Overall AHI 3.2.hour. We can refer her to orthodontics for oral appliance to help with snoring. Should follow-up with PCP for further fatigue work up. She has an apt with me on 12/27/22, she can keep or cancel.

## 2022-12-19 NOTE — Telephone Encounter (Signed)
Spoke with pt and reviewed sleep study results as dictated by Geraldo Pitter. Pt stated understanding of referral and wished to cancel OV on 12/27/22. OV canceled and referral placed. Nothing further needed at this time.

## 2022-12-19 NOTE — Telephone Encounter (Signed)
-----  Message from Martyn Ehrich, NP sent at 12/19/2022 12:37 PM EST ----- Sleep study was non-diagnostic for sleep apnea. Overall AHI 3.2.hour. We can refer her to orthodontics for oral appliance to help with snoring. Should follow-up with PCP for further fatigue work up. She has an apt with me on 12/27/22, she can keep or  cancel.

## 2022-12-20 NOTE — Telephone Encounter (Signed)
She does not need oral appliance. Would help with snoring if symptoms are clinically bothering her, but did not have enough events for dx sleep apnea. Can focus on side sleeping position, elevate head and avoid alcohol or sedative at bedtime.

## 2022-12-27 ENCOUNTER — Ambulatory Visit: Payer: 59 | Admitting: Primary Care

## 2023-10-05 ENCOUNTER — Other Ambulatory Visit: Payer: Self-pay

## 2023-10-05 ENCOUNTER — Telehealth: Payer: Self-pay | Admitting: Family Medicine

## 2023-10-05 ENCOUNTER — Encounter: Payer: Self-pay | Admitting: Medical Oncology

## 2023-10-05 ENCOUNTER — Emergency Department
Admission: EM | Admit: 2023-10-05 | Discharge: 2023-10-05 | Disposition: A | Discharge status: Home / Self Care | Payer: 59 | Attending: Emergency Medicine | Admitting: Emergency Medicine

## 2023-10-05 ENCOUNTER — Emergency Department: Payer: 59

## 2023-10-05 DIAGNOSIS — R002 Palpitations: Secondary | ICD-10-CM | POA: Diagnosis present

## 2023-10-05 DIAGNOSIS — R0789 Other chest pain: Secondary | ICD-10-CM | POA: Insufficient documentation

## 2023-10-05 LAB — CBC
HCT: 42 % (ref 36.0–46.0)
Hemoglobin: 14.8 g/dL (ref 12.0–15.0)
MCH: 31.4 pg (ref 26.0–34.0)
MCHC: 35.2 g/dL (ref 30.0–36.0)
MCV: 89 fL (ref 80.0–100.0)
Platelets: 283 10*3/uL (ref 150–400)
RBC: 4.72 MIL/uL (ref 3.87–5.11)
RDW: 12.2 % (ref 11.5–15.5)
WBC: 7 10*3/uL (ref 4.0–10.5)
nRBC: 0 % (ref 0.0–0.2)

## 2023-10-05 LAB — BASIC METABOLIC PANEL
Anion gap: 11 (ref 5–15)
BUN: 12 mg/dL (ref 6–20)
CO2: 23 mmol/L (ref 22–32)
Calcium: 9.1 mg/dL (ref 8.9–10.3)
Chloride: 104 mmol/L (ref 98–111)
Creatinine, Ser: 0.75 mg/dL (ref 0.44–1.00)
GFR, Estimated: >60 mL/min (ref >60)
Glucose, Bld: 107 mg/dL — ABNORMAL HIGH (ref 70–99)
Potassium: 4.1 mmol/L (ref 3.5–5.1)
Sodium: 138 mmol/L (ref 135–145)

## 2023-10-05 LAB — TSH: TSH: 3.031 u[IU]/mL (ref 0.350–4.500)

## 2023-10-05 LAB — TROPONIN I (HIGH SENSITIVITY): Troponin I (High Sensitivity): <2 ng/L (ref <18)

## 2023-10-05 NOTE — Telephone Encounter (Signed)
I spoke with pt and she is waiting on her husband to pick her up and take her to [facility] ED now. Sending note to Dr Milinda Antis and [employer].

## 2023-10-05 NOTE — Telephone Encounter (Signed)
Per EMR; patient is at ED currently.

## 2023-10-05 NOTE — ED Provider Triage Note (Signed)
Emergency Medicine Provider Triage Evaluation Note  Tracey Garcia , a 37 y.o. female  was evaluated in triage.  Pt complains of palpitations, hx thyroid disease, no cp.  Review of Systems  Positive:  Negative:   Physical Exam   Gen:   Awake, no distress   Resp:  Normal effort  MSK:   Moves extremities without difficulty  Other:    Medical Decision Making  Medically screening exam initiated at 2:45 PM.  Appropriate orders placed.  Tracey Garcia was informed that the remainder of the evaluation will be completed by another provider, this initial triage assessment does not replace that evaluation, and the importance of remaining in the ED until their evaluation is complete.     Tracey Ghee, PA-C 10/05/23 1446

## 2023-10-05 NOTE — Telephone Encounter (Signed)
FYI: This call has been transferred to Access Nurse. Once the result note has been entered staff can address the message at that time.  Patient called in with the following symptoms:  Red Word: rapid heartbeat Patient scheduled an appointment online for rapid heartbeat. She stated that it has been over 2 months and believes it due to stress. She stated that she had some chest tightness and that yesterday her heart rate was 117.   Please advise at Mobile [phone] (mobile)  Message is routed to Provider Pool and [facility] Triage

## 2023-10-05 NOTE — Telephone Encounter (Signed)
Aware, will watch for correspondence  If severe while waiting, call 911  Thanks

## 2023-10-05 NOTE — ED Triage Notes (Signed)
Pt reports that she has been experiencing irregular heart beat off and on for a couple weeks. States that at times she feels tightness in her chest.

## 2023-10-05 NOTE — ED Provider Notes (Signed)
[facility] Provider Note    Event Date/Time   First MD Initiated Contact with Patient 10/05/23 1610     (approximate)   History   Irregular Heart Beat   HPI  Tracey Garcia is a 36 y.o. female past medical history significant for thyroid disorder who presents to the emergency department with her palpitations.  States that she has been having intermittent episodes of heart palpitations where she feels like her heart is racing and then stops.  Mild chest tightness associated with it.  Earlier today heart rate elevated up to 150, believes that she was having a panic attack at that time.  Was having some paresthesias to her arms.  Called her primary care physician and told to come to the emergency department.  States that she has not had any symptoms since arriving to the emergency department.  Denies any active chest pain or shortness of breath.  Not on any estrogen replacement.  No history of DVT or PE.  No family history of coronary artery disease at a young age.  No vomiting or diarrhea.     Physical Exam   Triage Vital Signs: ED Triage Vitals  Encounter Vitals Group     BP 10/05/23 1450 (!) 143/80     Systolic BP Percentile --      Diastolic BP Percentile --      Pulse Rate 10/05/23 1447 89     Resp 10/05/23 1447 16     Temp 10/05/23 1447 98.4 F (36.9 C)     Temp Source 10/05/23 1447 Oral     SpO2 10/05/23 1447 99 %     Weight 10/05/23 1445 220 lb (99.8 kg)     Height 10/05/23 1445 5\' 8"  (1.727 m)     Head Circumference --      Peak Flow --      Pain Score 10/05/23 1445 3     Pain Loc --      Pain Education --      Exclude from Growth Chart --     Most recent vital signs: Vitals:   10/05/23 1447 10/05/23 1450  BP:  (!) 143/80  Pulse: 89   Resp: 16   Temp: 98.4 F (36.9 C)   SpO2: 99%     Physical Exam Constitutional:      Appearance: She is well-developed.  HENT:     Head: Atraumatic.  Eyes:     Conjunctiva/sclera: Conjunctivae  normal.  Cardiovascular:     Rate and Rhythm: Regular rhythm.     Heart sounds: No murmur heard. Pulmonary:     Effort: No respiratory distress.  Abdominal:     General: There is no distension.  Musculoskeletal:        General: Normal range of motion.     Cervical back: Normal range of motion.  Skin:    General: Skin is warm.  Neurological:     Mental Status: She is alert. Mental status is at baseline.     IMPRESSION / MDM / ASSESSMENT AND PLAN / ED COURSE  I reviewed the triage vital signs and the nursing notes.  Differential diagnosis including electrolyte abnormality, hyperthyroidism, atrial tachycardia, PVCs, ACS.  Low suspicion for pulmonary embolism, PERC negative  EKG  I, Corena Herter, the attending physician, personally viewed and interpreted this ECG.  Normal sinus rhythm.  Signs of atrial enlargement.  Incomplete right bundle branch block.  No significant ST elevation or depression.  No signs of acute ischemia  or dysrhythmia No prior EKG to compare   RADIOLOGY I independently reviewed imaging, my interpretation of imaging: Chest x-ray with no signs of pneumonia Read as  no acute findings  LABS (all labs ordered are listed, but only abnormal results are displayed) Labs interpreted as -    Labs Reviewed  BASIC METABOLIC PANEL - Abnormal; Notable for the following components:      Result Value   Glucose, Bld 107 (*)    All other components within normal limits  CBC  TSH  POC URINE PREG, ED  TROPONIN I (HIGH SENSITIVITY)  TROPONIN I (HIGH SENSITIVITY)     MDM  No significant electrolyte abnormalities.  Thyroid studies within normal limits.  Pregnancy test is negative.  Low risk heart score, troponin is undetectable, do not feel that serial troponin is necessary at this time.  Patient did have findings of atrial enlargement on her EKG, no history of tobacco use.  No shortness of breath or wheezing on my exam.  Differential diagnosis concerning for  palpitations, possible PVCs that are frequent versus an atrial tachycardia.  Placed a referral for cardiology for outpatient cardiac monitoring, patient has a follow-up appointment with her primary care physician on Tuesday.  Discussed decreasing caffeine intake.  Discussed return precautions to the emergency department.     PROCEDURES:  Critical Care performed: No  Procedures  Patient's presentation is most consistent with acute presentation with potential threat to life or bodily function.   MEDICATIONS ORDERED IN ED: Medications - No data to display  FINAL CLINICAL IMPRESSION(S) / ED DIAGNOSES   Final diagnoses:  Palpitations     Rx / DC Orders   ED Discharge Orders          Ordered    Ambulatory referral to Cardiology       Comments: If you have not heard from the Cardiology office within the next 72 hours please call [phone].   10/05/23 1723             Note:  This document was prepared using Dragon voice recognition software and may include unintentional dictation errors.   Corena Herter, MD 10/05/23 1740

## 2023-10-09 ENCOUNTER — Ambulatory Visit: Payer: 59 | Admitting: Family Medicine

## 2023-10-10 ENCOUNTER — Ambulatory Visit: Payer: 59 | Admitting: Family Medicine

## 2023-10-10 ENCOUNTER — Encounter: Payer: Self-pay | Admitting: Family Medicine

## 2023-10-10 ENCOUNTER — Telehealth: Payer: Self-pay

## 2023-10-10 VITALS — BP 126/82 | HR 72 | Temp 98.2°F | Ht 68.0 in | Wt 228.2 lb

## 2023-10-10 DIAGNOSIS — R002 Palpitations: Secondary | ICD-10-CM | POA: Insufficient documentation

## 2023-10-10 DIAGNOSIS — E039 Hypothyroidism, unspecified: Secondary | ICD-10-CM | POA: Diagnosis not present

## 2023-10-10 DIAGNOSIS — F411 Generalized anxiety disorder: Secondary | ICD-10-CM | POA: Diagnosis not present

## 2023-10-10 NOTE — Progress Notes (Signed)
Subjective:    Patient ID: Tracey Garcia, female    DOB: Dec 22, 1985, 37 y.o.   MRN: [id number]  HPI  Wt Readings from Last 3 Encounters:  10/10/23 228 lb 4 oz (103.5 kg)  10/05/23 220 lb (99.8 kg)  12/12/22 223 lb 8 oz (101.4 kg)   34.71 kg/m  Vitals:   10/10/23 1052  BP: 126/82  Pulse: 72  Temp: 98.2 F (36.8 C)  SpO2: 99%     Pt presents for follow up of ER visit 10/05/23 for palpitations and chest tightness  Possibly due to panic attack  Presented after HR went up to 150 (did not feel bad but felt racing heart beat)     EKG noted no acute changes, did have incomplete RBBB  They did refer her for cardiology to consider outpt monitoring   Had sleep study for snoring in the past year Non dx of sleep apnea AHI 3.2 per hour  Discussed oral appliance for sleep   She wears apple watch  One EKG was inconclusive -day of the event   She is anxious about her health  Lost her BIL at her age to heart problems   Has woken up feeling like heart was pounding in the past and pulse was 80      Lab Results  Component Value Date   NA 138 10/05/2023   K 4.1 10/05/2023   CO2 23 10/05/2023   GLUCOSE 107 (H) 10/05/2023   BUN 12 10/05/2023   CREATININE 0.75 10/05/2023   CALCIUM 9.1 10/05/2023   GFR 104.66 12/07/2022   GFRNONAA >60 10/05/2023   Lab Results  Component Value Date   TSH 3.031 10/05/2023   Lab Results  Component Value Date   WBC 7.0 10/05/2023   HGB 14.8 10/05/2023   HCT 42.0 10/05/2023   MCV 89.0 10/05/2023   PLT 283 10/05/2023   Trop less than 2   DG Chest 2 View  Result Date: 10/05/2023 CLINICAL DATA:  Chest tightness. EXAM: CHEST - 2 VIEW COMPARISON:  None Available. FINDINGS: Bilateral lung fields are clear. Bilateral costophrenic angles are clear. Normal cardio-mediastinal silhouette. No acute osseous abnormalities. The soft tissues are within normal limits. IMPRESSION: *No active cardiopulmonary disease. Electronically Signed   By: Jules Schick M.D.   On: 10/05/2023 16:36    BP Readings from Last 3 Encounters:  10/10/23 126/82  10/05/23 (!) 143/80  12/12/22 118/72   Pulse Readings from Last 3 Encounters:  10/10/23 72  10/05/23 89  12/12/22 85    History of GAD Dealt with this lifelong with good coping skills Did marriage counseling in distant past  Has anxiety about shots /needles in the past   On average she feels palpiatons 1-2 times per week Usually mid day  Occational has woken up from sleep   Tightness in chest is her baseline   Attrib to anxiety    No heartburn that she knows of      10/10/2023   11:35 AM 04/11/2022    2:07 PM 12/08/2021    9:27 AM 11/23/2020   12:35 PM 11/20/2019   12:43 PM  Depression screen PHQ 2/9  Decreased Interest 1 0 1 0 0  Down, Depressed, Hopeless 1 0 1 0 0  PHQ - 2 Score 2 0 2 0 0  Altered sleeping 1  0 1 1  Tired, decreased energy 1  1 1 1   Change in appetite 0  0 0 0  Feeling bad or failure about  yourself  1  2 1 1   Trouble concentrating 2  2 1  0  Moving slowly or fidgety/restless 0  1 0 0  Suicidal thoughts 0  0 0 0  PHQ-9 Score 7  8 4 3   Difficult doing work/chores Not difficult at all  Somewhat difficult Not difficult at all       10/10/2023   11:35 AM  GAD 7 : Generalized Anxiety Score  Nervous, Anxious, on Edge 2  Control/stop worrying 1  Worry too much - different things 2  Trouble relaxing 0  Restless 0  Easily annoyed or irritable 1  Afraid - awful might happen 2  Total GAD 7 Score 8  Anxiety Difficulty Not difficult at all   GAD7 8   Very stressful past 6 months  Not prioritizing health     Has considered treatment for anxiety  Did therapy and it was helpful   Wants to avoid medication if possible  Had investigated a "natural" product   Has not tried CBD oil  Lorain Childes    Fam history  Mother had tachycardia-? What kind  Gmother with afib   Lab Results  Component Value Date   CHOL 199 12/07/2022   HDL 74.50 12/07/2022    LDLCALC 111 (H) 12/07/2022   TRIG 70.0 12/07/2022   CHOLHDL 3 12/07/2022     Patient Active Problem List   Diagnosis Date Noted   Palpitations 10/10/2023   Snoring 10/31/2022   Fatigue 04/11/2022   Hypothyroid 02/07/2022   Right knee pain 08/30/2017   Family history of colonic polyps 08/23/2017   Recurrent sinusitis 10/23/2015   Routine general medical examination at a health care facility 10/20/2014   IBS (irritable bowel syndrome) 11/04/2013   Generalized anxiety disorder 11/04/2013   Past Medical History:  Diagnosis Date   Allergy    Anxiety    Anxiety    History of IBS    History of seasonal allergies    Thyroid disease    Past Surgical History:  Procedure Laterality Date   UMBILICAL HERNIA REPAIR Bilateral 01/2005   Social History   Tobacco Use   Smoking status: Never   Smokeless tobacco: Never  Vaping Use   Vaping status: Never Used  Substance Use Topics   Alcohol use: Yes    Alcohol/week: 0.0 standard drinks of alcohol    Comment: socially   Drug use: No   Family History  Problem Relation Age of Onset   Cancer Mother    Celiac disease Mother    Lung cancer Mother    Other Mother        AV node Tachycardia   High Cholesterol Father    Syncope episode Father        micturition syncope   Colon polyps Father    Arthritis Maternal Grandmother    High blood pressure Maternal Grandmother    Sjogren's syndrome Maternal Grandmother    Heart disease Maternal Grandfather    Diabetes Maternal Grandfather    High blood pressure Paternal Grandmother    Heart attack Paternal Grandfather    Colon cancer Other        great grandmother   Stomach cancer Neg Hx    Esophageal cancer Neg Hx    Allergies  Allergen Reactions   Chlorine     Trouble breathing    Current Outpatient Medications on File Prior to Visit  Medication Sig Dispense Refill   fluticasone (FLONASE) 50 MCG/ACT nasal spray Place 2 sprays into both  nostrils daily as needed for allergies or  rhinitis.     levothyroxine (SYNTHROID) 25 MCG tablet Take 1 tablet (25 mcg total) by mouth daily. 90 tablet 3   loratadine (CLARITIN) 10 MG tablet      No current facility-administered medications on file prior to visit.    Review of Systems  Constitutional:  Positive for fatigue. Negative for activity change, appetite change, fever and unexpected weight change.  HENT:  Negative for congestion, rhinorrhea, sore throat and trouble swallowing.   Eyes:  Negative for pain, redness, itching and visual disturbance.  Respiratory:  Negative for cough, chest tightness, shortness of breath and wheezing.   Cardiovascular:  Positive for chest pain and palpitations. Negative for leg swelling.  Gastrointestinal:  Negative for abdominal pain, blood in stool, constipation, diarrhea and nausea.  Endocrine: Negative for cold intolerance, heat intolerance, polydipsia and polyuria.  Genitourinary:  Negative for difficulty urinating, dysuria, frequency and urgency.  Musculoskeletal:  Negative for arthralgias, joint swelling and myalgias.  Skin:  Negative for pallor and rash.  Neurological:  Negative for dizziness, tremors, weakness, numbness and headaches.  Hematological:  Negative for adenopathy. Does not bruise/bleed easily.  Psychiatric/Behavioral:  Positive for sleep disturbance. Negative for decreased concentration and dysphoric mood. The patient is nervous/anxious.        Objective:   Physical Exam Constitutional:      General: She is not in acute distress.    Appearance: Normal appearance. She is well-developed. She is obese. She is not ill-appearing or diaphoretic.  HENT:     Head: Normocephalic and atraumatic.     Mouth/Throat:     Mouth: Mucous membranes are moist.  Eyes:     Conjunctiva/sclera: Conjunctivae normal.     Pupils: Pupils are equal, round, and reactive to light.  Neck:     Thyroid: No thyromegaly.     Vascular: No carotid bruit or JVD.  Cardiovascular:     Rate and Rhythm:  Normal rate and regular rhythm.     Heart sounds: Normal heart sounds.     No gallop.  Pulmonary:     Effort: Pulmonary effort is normal. No respiratory distress.     Breath sounds: Normal breath sounds. No wheezing or rales.  Abdominal:     General: There is no distension or abdominal bruit.     Palpations: Abdomen is soft.  Musculoskeletal:     Cervical back: Normal range of motion and neck supple.     Right lower leg: No edema.     Left lower leg: No edema.  Lymphadenopathy:     Cervical: No cervical adenopathy.  Skin:    General: Skin is warm and dry.     Coloration: Skin is not pale.     Findings: No rash.  Neurological:     Mental Status: She is alert.     Coordination: Coordination normal.     Deep Tendon Reflexes: Reflexes are normal and symmetric. Reflexes normal.  Psychiatric:        Attention and Perception: Attention normal.        Mood and Affect: Mood is anxious.        Speech: Speech normal.        Behavior: Behavior normal.        Cognition and Memory: Cognition and memory normal.     Comments: Candidly discusses symptoms and stressors  Good insight            Assessment & Plan:   Problem List Items  Addressed This Visit       Endocrine   Hypothyroid    Hypothyroidism  Pt has no clinical changes No change in energy level/ hair or skin/ edema and no tremor Lab Results  Component Value Date   TSH 3.031 10/05/2023    Plan to continue levothyroxine 25 mcg daily         Other   Generalized anxiety disorder    Worsened lately in setting of increase stress Some health worry Reviewed stressors/ coping techniques/symptoms/ support sources/ tx options and side effects in detail today   Has had counseling Declines prescription med but may consider later Discussed role of ashwanda or CBD oil  Also exercise / self care Discussed meditation as well  Will continue to follow        Palpitations - Primary    Noted more lately -some rapid HR / some  c/o heart pounding when not fast  Seen in ER recently -unremarkable work up but did have RBBB in EKG  Reviewed hospital records, lab results and studies in detail  Reviewed fam history  Some anxiety about health-may contrib Reassuring exam Cardiology ref was done by ER-she will call for that  Blood pressure and lipids are stable  Wears fitness watch also to monitor pulse  Discussed possible role of anxious mood also

## 2023-10-10 NOTE — Assessment & Plan Note (Signed)
Worsened lately in setting of increase stress Some health worry Reviewed stressors/ coping techniques/symptoms/ support sources/ tx options and side effects in detail today   Has had counseling Declines prescription med but may consider later Discussed role of ashwanda or CBD oil  Also exercise / self care Discussed meditation as well  Will continue to follow

## 2023-10-10 NOTE — Assessment & Plan Note (Signed)
Noted more lately -some rapid HR / some c/o heart pounding when not fast  Seen in ER recently -unremarkable work up but did have RBBB in EKG  Reviewed hospital records, lab results and studies in detail  Reviewed fam history  Some anxiety about health-may contrib Reassuring exam Cardiology ref was done by ER-she will call for that  Blood pressure and lipids are stable  Wears fitness watch also to monitor pulse  Discussed possible role of anxious mood also

## 2023-10-10 NOTE — Transitions of Care (Post Inpatient/ED Visit) (Unsigned)
Did not call [facility] ED FU due to pt saw Dr Milinda Antis on 10/10/23 for ED FU. Sending FYI to DrTower.     10/10/2023  Name: Tracey Garcia MRN: [id number] DOB: Aug 14, 1986  Today's TOC FU Call Status:   Patient's Name and Date of Birth confirmed.  Transition Care Management Follow-up Telephone Call    Items Reviewed:    Medications Reviewed Today: Medications Reviewed Today   Medications were not reviewed in this encounter     Home Care and Equipment/Supplies:    Functional Questionnaire:    Follow up appointments reviewed:      SIGNATURE Lewanda Rife, LPN

## 2023-10-10 NOTE — Assessment & Plan Note (Signed)
Hypothyroidism  Pt has no clinical changes No change in energy level/ hair or skin/ edema and no tremor Lab Results  Component Value Date   TSH 3.031 10/05/2023    Plan to continue levothyroxine 25 mcg daily

## 2023-10-10 NOTE — Patient Instructions (Addendum)
Call [phone] for cardiology referral from ER If any problems let me know   Exercise is very important for anxiety  Meditation is helpful also  The apps Calm and Headspace are my favorite   Author Nyoka Lint - writes about mindfulness   You can try Ashwagonda again - with caution  If not helpful don't spend $  If stress persists- get back to counseling   If you want to discuss medication in future let us know

## 2023-10-16 ENCOUNTER — Ambulatory Visit: Payer: 59

## 2023-10-16 ENCOUNTER — Ambulatory Visit: Payer: 59 | Attending: Cardiovascular Disease | Admitting: Cardiovascular Disease

## 2023-10-16 ENCOUNTER — Encounter: Payer: Self-pay | Admitting: Cardiovascular Disease

## 2023-10-16 VITALS — BP 120/82 | HR 77 | Ht 68.0 in | Wt 225.5 lb

## 2023-10-16 DIAGNOSIS — I479 Paroxysmal tachycardia, unspecified: Secondary | ICD-10-CM | POA: Diagnosis not present

## 2023-10-16 DIAGNOSIS — R002 Palpitations: Secondary | ICD-10-CM | POA: Diagnosis not present

## 2023-10-16 DIAGNOSIS — R5382 Chronic fatigue, unspecified: Secondary | ICD-10-CM

## 2023-10-16 NOTE — Progress Notes (Signed)
Cardiology Office Note  Date:  10/16/2023   ID:  Copelynn Chiodi, DOB 02-Jan-1986, MRN [id number]  PCP:  Judy Pimple, MD   Chief Complaint  Patient presents with   New Patient (Initial Visit)    Ref by Dr. Milinda Antis for palpitations. Patient was at [facility] on 10/05/2023 with palpitations, racing heart beats & chest pain/tightness. Medications reviewed by the patient verbally.     HPI:  Ms. Chenequa Chiaverini is a 37 year old woman with past medical history of Thyroid disorder Who presents by referral from Dr. 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, sign language interpreter, self-employed  Lab work reviewed TSH 3.03  EKG personally reviewed by myself on todays visit EKG Interpretation Date/Time:  Monday October 16 2023 15:49:34 EST Ventricular Rate:  77 PR Interval:  138 QRS Duration:  98 QT Interval:  390 QTC Calculation: 441 R Axis:   66  Text Interpretation: Normal sinus rhythm When compared with ECG of 05-Oct-2023 14:44, No significant change was found Confirmed by Julien Nordmann [phone]) on 10/16/2023 4:07:27 PM    PMH:   has a past medical history of Allergy, Anxiety, Anxiety, History of IBS, History of seasonal allergies, and Thyroid disease.  PSH:    Past Surgical History:  Procedure Laterality Date   UMBILICAL HERNIA REPAIR Bilateral 01/2005    Current Outpatient Medications  Medication Sig Dispense Refill   fluticasone (FLONASE) 50 MCG/ACT nasal spray Place 2 sprays into both nostrils daily as needed for allergies or rhinitis.      levothyroxine (SYNTHROID) 25 MCG tablet Take 1 tablet (25 mcg total) by mouth daily. 90 tablet 3   loratadine (CLARITIN) 10 MG tablet      No current facility-administered medications for this visit.     Allergies:   Chlorine   Social History:  The patient  reports that she has never smoked. She has never used smokeless tobacco. She reports current alcohol use. She reports that she does not use drugs.   Family History:   family history includes Arthritis in her maternal grandmother; Cancer in her mother; Celiac disease in her mother; Colon cancer in an other family member; Colon polyps in her father; Diabetes in her maternal grandfather; Heart attack (age of onset: 66) in her paternal grandfather; Heart disease in her maternal grandfather; High Cholesterol in her father and sister; High blood pressure in her maternal grandmother and paternal grandmother; Lung cancer in her mother; Other in her mother; Sjogren's syndrome in her maternal grandmother; Syncope episode in her father.    Review of Systems: Review of Systems  Constitutional: Negative.   HENT: Negative.    Respiratory: Negative.    Cardiovascular:  Positive for palpitations.  Gastrointestinal: Negative.   Musculoskeletal: Negative.   Neurological: Negative.   Psychiatric/Behavioral: Negative.    All other systems reviewed and are negative.    PHYSICAL EXAM: VS:  BP 120/82 (BP Location: Right Arm, Patient Position: Sitting, Cuff Size: Normal)   Pulse 77   Ht 5\' 8"  (1.727 m)   Wt 225 lb 8 oz (102.3 kg)   LMP  09/14/2023   SpO2 99%   BMI 34.29 kg/m  , BMI Body mass index is 34.29 kg/m. GEN: Well nourished, well developed, in no acute distress HEENT: normal Neck: no JVD, carotid bruits, or masses Cardiac: RRR; no murmurs, rubs, or gallops,no edema  Respiratory:  clear to auscultation bilaterally, normal work of breathing GI: soft, nontender, nondistended, + BS MS: no deformity or atrophy Skin: warm and dry, no  rash Neuro:  Strength and sensation are intact Psych: euthymic mood, full affect   Recent Labs: 12/07/2022: ALT 14 10/05/2023: BUN 12; Creatinine, Ser 0.75; Hemoglobin 14.8; Platelets 283; Potassium 4.1; Sodium 138; TSH 3.031    Lipid Panel Lab Results  Component Value Date   CHOL 199 12/07/2022   HDL 74.50 12/07/2022   LDLCALC 111 (H) 12/07/2022   TRIG 70.0 12/07/2022      Wt Readings from Last 3 Encounters:  10/16/23 225 lb 8 oz (102.3 kg)  10/10/23 228 lb 4 oz (103.5 kg)  10/05/23 220 lb (99.8 kg)      ASSESSMENT AND PLAN:  Problem List Items Addressed This Visit     Palpitations   Relevant Orders   EKG 12-Lead (Completed)   LONG TERM MONITOR (3-14 DAYS)   Fatigue   Relevant Orders   EKG 12-Lead (Completed)   Other Visit Diagnoses     Paroxysmal tachycardia (HCC)    -  Primary   Relevant Orders   EKG 12-Lead (Completed)   LONG TERM MONITOR (3-14 DAYS)      Paroxysmal tachycardia Etiology unclear Unable to exclude cardiac arrhythmia such as atrial tachycardia episodes She does report significant stress/anxiety through work though this is slowly improving Prefers not to start a medication at this time Recommended Zio monitor for further data to help guide therapy Essentially normal EKG and clinical exam, will hold off on echocardiogram For continued symptoms, could use short acting propranolol 10 up to 20 mg as needed For more frequent episodes could try metoprolol to tartrate twice daily or low-dose succinate  Work stress 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 Milinda Antis and will be referred back to her office for ongoing care of the issues detailed above  Signed, Dossie Arbour, M.D., Ph.D. [facility] [place], [place] [phone]

## 2023-10-16 NOTE — Patient Instructions (Addendum)
 Medication Instructions:  No changes  If you need a refill on your cardiac medications before your next appointment, please call your pharmacy.   Lab work: No new labs needed  Testing/Procedures: Heart Monitor:  Your physician has requested you wear a ZIO monitor for 14  days.  Your monitor will be mailed to your home address within 3-5 business days. This is sent via [employer] from [employer]. However, if you have not received your monitor after 5 business days please send [place] a MyChart message or call the office at [phone], so we may follow up on this for you.   This monitor is a medical device (single patch monitor) that records the heart's electrical activity. Doctors most often use these monitors to diagnose arrhythmias. Arrhythmias are problems with the speed or rhythm of the heartbeat.   [employer] supplies 1 patch per enrollment. Additional stickers are not available.  Please DO NOT apply the patch if you will be having a Nuclear Stress Test, Echocardiogram, Cardiac CT, Cardiac MRI, Chest X-ray during the period you would be wearing the monitor. The patch cannot be worn during these tests.  You cannot remove and re-apply the ZIO patch monitor.   Applying the Monitor: Once you receive your monitor, this will include a small razor, abrader, and 4 alcohol pads. Shave hair from upper left chest Rub abrader disc in 40 strokes over the left upper chest as indicated in your monitor instructions Clean area with 4 enclosed alcohol pads (there may be a mild & brief stinging sensation over the newly abraded area, but this is normal). Let dry Apply patch as indicated in monitor instructions. Patch will be placed under collarbone on the left side of the chest with arrow pointing upward. Rub adhesive wings for 2 minutes. Remove white label marked "1". Remove the white label marked "2". Rub patch adhesive wings for an 2 minutes.  While looking in a mirror, press and release button in  the center of the patch. You may hear a "click". A small green light will flash 4-6 times and then stop. This will be your indicator that the monitor has been turned on.  Wearing the Monitor: Avoid showering during the first 24 hours of wearing the monitor.  After 24 hours you may shower with the patch on. Take brief showers with your back facing the shower head.  Avoid excessive sweating to help maximize wear time. Do not submerge the device, no hot tubs, and no swimming pools. Keep any lotions or oils away from the patch. Press the button if you feel a symptom. You will hear a small click. Record date, time, and symptoms in the Patient Logbook or App.  Monitor Issues: Call [employer] Customer Care at [phone] if you have questions regarding your Zio Patch Monitor. Call them immediately if you see an orange/ amber colored light blinking on your monitor. If your monitor falls off and you cannot get this reapplied or if you need suggestions for securing your monitor call [employer] at [phone].   Returning the Monitor: Once you have completed wearing your monitor, follow instructions on the last 2 pages of the Patient Logbook. Stick monitor patch on to the last page of the Patient Logbook.  Place Patient Logbook with monitor in the return box provided. Use locking tab on box and tape box closed securely. The return box has pre-paid postage on it.  Place the return box in the regular [place] Mail box as soon as possible It  will take anywhere from 1-2 weeks for your provider to receive and review your results once you mail this back. If for some reason you have misplaced your return box then call our office and we can provide another box and/or mail it off for you.   Billing  and Patient Assistance Program Information: We have supplied [employer] with any of your insurance information on file for billing purposes. [employer] offers a sliding scale Patient Assistance Program for patients  that do not have insurance, or whose insurance does not completely cover the cost of the ZIO monitor. You must apply for the Patient Assistance Program to qualify for this discounted rate. To apply, please call [employer] at [phone], select option 1, ask to apply for the Patient Assistance Program. [employer] will ask your household income, and how many people are in your household. They will quote your out-of-pocket cost based on that information. [employer] will also be able to set up for a 43-month, interest-free payment plan if needed.     Follow-Up: At [facility], you and your health needs are our priority.  As part of our continuing mission to provide you with exceptional heart care, we have created designated Provider Care Teams.  These Care Teams include your primary Cardiologist (physician) and Advanced Practice Providers (APPs -  Physician Assistants and Nurse Practitioners) who all work together to provide you with the care you need, when you need it.  You will need a follow up appointment as needed  Providers on your designated Care Team:   Nicolasa Ducking, NP Eula Listen, PA-C Cadence Fransico Michael, [place]  COVID-19 Vaccine Information can be found at: [email or web address] For questions related to vaccine distribution or appointments, please email [email or web address] or call [phone].

## 2023-10-19 DIAGNOSIS — R002 Palpitations: Secondary | ICD-10-CM

## 2023-12-06 ENCOUNTER — Telehealth: Payer: Self-pay | Admitting: Family Medicine

## 2023-12-06 DIAGNOSIS — Z0000 Encounter for general adult medical examination without abnormal findings: Secondary | ICD-10-CM

## 2023-12-06 DIAGNOSIS — E039 Hypothyroidism, unspecified: Secondary | ICD-10-CM

## 2023-12-06 NOTE — Telephone Encounter (Signed)
-----   Message from Alvina Chou sent at 12/04/2023  2:41 PM EST ----- Regarding: Lab orders for Thurs, 1.2.25 Patient is scheduled for CPX labs, please order future labs, Thanks , Camelia Eng

## 2023-12-07 ENCOUNTER — Other Ambulatory Visit: Payer: 59

## 2023-12-08 ENCOUNTER — Other Ambulatory Visit (INDEPENDENT_AMBULATORY_CARE_PROVIDER_SITE_OTHER): Payer: 59

## 2023-12-08 DIAGNOSIS — Z0000 Encounter for general adult medical examination without abnormal findings: Secondary | ICD-10-CM | POA: Diagnosis not present

## 2023-12-08 DIAGNOSIS — E039 Hypothyroidism, unspecified: Secondary | ICD-10-CM

## 2023-12-08 LAB — CBC WITH DIFFERENTIAL/PLATELET
Basophils Absolute: 0 10*3/uL (ref 0.0–0.1)
Basophils Relative: 0.5 % (ref 0.0–3.0)
Eosinophils Absolute: 0.1 10*3/uL (ref 0.0–0.7)
Eosinophils Relative: 2.4 % (ref 0.0–5.0)
HCT: 43.4 % (ref 36.0–46.0)
Hemoglobin: 14.6 g/dL (ref 12.0–15.0)
Lymphocytes Relative: 37.6 % (ref 12.0–46.0)
Lymphs Abs: 1.5 10*3/uL (ref 0.7–4.0)
MCHC: 33.6 g/dL (ref 30.0–36.0)
MCV: 93.9 fL (ref 78.0–100.0)
Monocytes Absolute: 0.4 10*3/uL (ref 0.1–1.0)
Monocytes Relative: 9.7 % (ref 3.0–12.0)
Neutro Abs: 2 10*3/uL (ref 1.4–7.7)
Neutrophils Relative %: 49.8 % (ref 43.0–77.0)
Platelets: 263 10*3/uL (ref 150.0–400.0)
RBC: 4.63 Mil/uL (ref 3.87–5.11)
RDW: 12.9 % (ref 11.5–15.5)
WBC: 4.1 10*3/uL (ref 4.0–10.5)

## 2023-12-08 LAB — LIPID PANEL
Cholesterol: 226 mg/dL — ABNORMAL HIGH (ref 0–200)
HDL: 76.9 mg/dL (ref >39.00)
LDL Cholesterol: 139 mg/dL — ABNORMAL HIGH (ref 0–99)
NonHDL: 149.49
Total CHOL/HDL Ratio: 3
Triglycerides: 51 mg/dL (ref 0.0–149.0)
VLDL: 10.2 mg/dL (ref 0.0–40.0)

## 2023-12-08 LAB — COMPREHENSIVE METABOLIC PANEL
ALT: 16 U/L (ref 0–35)
AST: 21 U/L (ref 0–37)
Albumin: 4.2 g/dL (ref 3.5–5.2)
Alkaline Phosphatase: 71 U/L (ref 39–117)
BUN: 14 mg/dL (ref 6–23)
CO2: 25 meq/L (ref 19–32)
Calcium: 9 mg/dL (ref 8.4–10.5)
Chloride: 103 meq/L (ref 96–112)
Creatinine, Ser: 0.78 mg/dL (ref 0.40–1.20)
GFR: 97.57 mL/min (ref >60.00)
Glucose, Bld: 86 mg/dL (ref 70–99)
Potassium: 4.7 meq/L (ref 3.5–5.1)
Sodium: 137 meq/L (ref 135–145)
Total Bilirubin: 0.7 mg/dL (ref 0.2–1.2)
Total Protein: 6.7 g/dL (ref 6.0–8.3)

## 2023-12-08 LAB — TSH: TSH: 5.46 u[IU]/mL (ref 0.35–5.50)

## 2023-12-14 ENCOUNTER — Encounter: Payer: Self-pay | Admitting: Family Medicine

## 2023-12-14 ENCOUNTER — Ambulatory Visit (INDEPENDENT_AMBULATORY_CARE_PROVIDER_SITE_OTHER): Payer: 59 | Admitting: Family Medicine

## 2023-12-14 VITALS — BP 126/72 | HR 86 | Temp 98.7°F | Ht 68.5 in | Wt 228.4 lb

## 2023-12-14 DIAGNOSIS — Z83719 Family history of colon polyps, unspecified: Secondary | ICD-10-CM | POA: Diagnosis not present

## 2023-12-14 DIAGNOSIS — Z0000 Encounter for general adult medical examination without abnormal findings: Secondary | ICD-10-CM

## 2023-12-14 DIAGNOSIS — E039 Hypothyroidism, unspecified: Secondary | ICD-10-CM | POA: Diagnosis not present

## 2023-12-14 DIAGNOSIS — F411 Generalized anxiety disorder: Secondary | ICD-10-CM | POA: Diagnosis not present

## 2023-12-14 DIAGNOSIS — E7800 Pure hypercholesterolemia, unspecified: Secondary | ICD-10-CM

## 2023-12-14 DIAGNOSIS — R002 Palpitations: Secondary | ICD-10-CM

## 2023-12-14 DIAGNOSIS — E785 Hyperlipidemia, unspecified: Secondary | ICD-10-CM | POA: Insufficient documentation

## 2023-12-14 MED ORDER — BUSPIRONE HCL 15 MG PO TABS: 7.5000 mg | ORAL_TABLET | Freq: Two times a day (BID) | ORAL | 3 refills | Status: AC | PRN

## 2023-12-14 MED ORDER — LEVOTHYROXINE SODIUM 25 MCG PO TABS: 25.0000 ug | ORAL_TABLET | Freq: Every day | ORAL | 3 refills | Status: AC

## 2023-12-14 NOTE — Assessment & Plan Note (Signed)
 Improved Had cardiac monitor that was reassuring

## 2023-12-14 NOTE — Assessment & Plan Note (Signed)
 Hypothyroidism  Pt has no clinical changes No change in energy level/ hair or skin/ edema and no tremor Lab Results  Component Value Date   TSH 5.46 12/08/2023    Plan to continue levothyroxine 25 mcg daily

## 2023-12-14 NOTE — Progress Notes (Signed)
 Subjective:    Patient ID: Tracey Garcia, female    DOB: [birth year], 38 y.o.   MRN: [id number]  HPI  Here for health maintenance exam and to review chronic medical problems   Wt Readings from Last 3 Encounters:  12/14/23 228 lb 6 oz (103.6 kg)  10/16/23 225 lb 8 oz (102.3 kg)  10/10/23 228 lb 4 oz (103.5 kg)   34.22 kg/m  Vitals:   12/14/23 1104  BP: 126/72  Pulse: 86  Temp: 98.7 F (37.1 C)  SpO2: 97%    Immunization History  Administered Date(s) Administered   Hepatitis A, Adult 06/05/2018, 11/20/2019   Influenza,inj,Quad PF,6+ Mos 12/08/2021, 12/12/2022   Influenza-Unspecified 09/18/2020, 08/24/2023   Moderna Sars-Covid-2 Vaccination 12/17/2019, 01/14/2020, 10/28/2020   PPD Test 08/12/2016   Pfizer(Comirnaty)Fall Seasonal Vaccine 12 years and older 08/24/2023   Tdap 12/19/2014    Health Maintenance Due  Topic Date Due   Hepatitis C Screening  Never done   Doing ok    Mammogram- not 41 yet  Self breast exam-no lumps / just had gyn exam   Gyn health Pap 07/2021 neg at gyn Sees Dr Linette was 11/2023  No pap this year    Colon cancer screening -colonoscopy 09/2022 with 5 y recall - polyps   Bone health   Falls- none  Fractures-none  Supplements - just started some magnesium  Ashwaganda    Exercise  Walking when able to get outside for cardio  Some work out videos on line   Regular derm care Lot of moles  Good about sun protection   Mood    12/14/2023   11:10 AM 10/10/2023   11:35 AM 04/11/2022    2:07 PM 12/08/2021    9:27 AM 11/23/2020   12:35 PM  Depression screen PHQ 2/9  Decreased Interest 1 1 0 1 0  Down, Depressed, Hopeless 1 1 0 1 0  PHQ - 2 Score 2 2 0 2 0  Altered sleeping 3 1  0 1  Tired, decreased energy 2 1  1 1   Change in appetite 0 0  0 0  Feeling bad or failure about yourself  1 1  2 1   Trouble concentrating 3 2  2 1   Moving slowly or fidgety/restless 0 0  1 0  Suicidal thoughts 0 0  0 0  PHQ-9 Score 11 7  8 4    Difficult doing work/chores Somewhat difficult Not difficult at all  Somewhat difficult Not difficult at all      12/14/2023   11:11 AM 10/10/2023   11:35 AM  GAD 7 : Generalized Anxiety Score  Nervous, Anxious, on Edge 2 2  Control/stop worrying 0 1  Worry too much - different things 3 2  Trouble relaxing 0 0  Restless 0 0  Easily annoyed or irritable 1 1  Afraid - awful might happen 3 2  Total GAD 7 Score 9 8  Anxiety Difficulty Somewhat difficult Not difficult at all   Ashwagonda for anxiety and sleep  Is helping  Sleep is better   Feels like anixety is better than it was a month ago   Had cardiac monitor and that was very reassuring   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  Is able to take time for self care   Is interested in a prn med for more severe anxiety    History of GAD Counseling in the past    Hypothyroidism  Pt has no clinical changes No change in  energy level/ hair or skin/ edema and no tremor Lab Results  Component Value Date   TSH 5.46 12/08/2023    Levothyroxine  25 mcg daily   Cholesterol Lab Results  Component Value Date   CHOL 226 (H) 12/08/2023   CHOL 199 12/07/2022   CHOL 206 (H) 12/07/2021   Lab Results  Component Value Date   HDL 76.90 12/08/2023   HDL 74.50 12/07/2022   HDL 71.50 12/07/2021   Lab Results  Component Value Date   LDLCALC 139 (H) 12/08/2023   LDLCALC 111 (H) 12/07/2022   LDLCALC 121 (H) 12/07/2021   Lab Results  Component Value Date   TRIG 51.0 12/08/2023   TRIG 70.0 12/07/2022   TRIG 69.0 12/07/2021   Lab Results  Component Value Date   CHOLHDL 3 12/08/2023   CHOLHDL 3 12/07/2022   CHOLHDL 3 12/07/2021   No results found for: LDLDIRECT Eating was not optimal last few months   Will watch diet more closely     Patient Active Problem List   Diagnosis Date Noted   Hyperlipidemia 12/14/2023   Palpitations 10/10/2023   Snoring 10/31/2022   Fatigue 04/11/2022   Hypothyroid 02/07/2022   Right knee  pain 08/30/2017   Family history of colonic polyps 08/23/2017   Recurrent sinusitis 10/23/2015   Routine general medical examination at a health care facility 10/20/2014   IBS (irritable bowel syndrome) 11/04/2013   Generalized anxiety disorder 11/04/2013   Past Medical History:  Diagnosis Date   Allergy    Anxiety    Anxiety    History of IBS    History of seasonal allergies    Thyroid  disease    Past Surgical History:  Procedure Laterality Date   UMBILICAL HERNIA REPAIR Bilateral 01/2005   Social History   Tobacco Use   Smoking status: Never   Smokeless tobacco: Never  Vaping Use   Vaping status: Never Used  Substance Use Topics   Alcohol use: Yes    Alcohol/week: 0.0 standard drinks of alcohol    Comment: socially   Drug use: No   Family History  Problem Relation Age of Onset   Cancer Mother    Celiac disease Mother    Lung cancer Mother    Other Mother        AV node Tachycardia   High Cholesterol Father    Syncope episode Father        micturition syncope   Colon polyps Father    High Cholesterol Sister    Arthritis Maternal Grandmother    High blood pressure Maternal Grandmother    Sjogren's syndrome Maternal Grandmother    Heart disease Maternal Grandfather    Diabetes Maternal Grandfather    High blood pressure Paternal Grandmother    Heart attack Paternal Grandfather 59   Colon cancer Other        great grandmother   Stomach cancer Neg Hx    Esophageal cancer Neg Hx    Allergies  Allergen Reactions   Chlorine     Trouble breathing    Current Outpatient Medications on File Prior to Visit  Medication Sig Dispense Refill   fluticasone (FLONASE) 50 MCG/ACT nasal spray Place 2 sprays into both nostrils daily as needed for allergies or rhinitis.     loratadine (CLARITIN) 10 MG tablet      No current facility-administered medications on file prior to visit.    Review of Systems  Constitutional:  Negative for activity change, appetite change,  fatigue, fever and  unexpected weight change.  HENT:  Negative for congestion, ear pain, rhinorrhea, sinus pressure and sore throat.   Eyes:  Negative for pain, redness and visual disturbance.  Respiratory:  Negative for cough, shortness of breath and wheezing.   Cardiovascular:  Negative for chest pain and palpitations.  Gastrointestinal:  Negative for abdominal pain, blood in stool, constipation and diarrhea.  Endocrine: Negative for polydipsia and polyuria.  Genitourinary:  Negative for dysuria, frequency and urgency.  Musculoskeletal:  Negative for arthralgias, back pain and myalgias.  Skin:  Negative for pallor and rash.  Allergic/Immunologic: Negative for environmental allergies.  Neurological:  Negative for dizziness, syncope and headaches.  Hematological:  Negative for adenopathy. Does not bruise/bleed easily.  Psychiatric/Behavioral:  Positive for dysphoric mood and sleep disturbance. Negative for decreased concentration. The patient is nervous/anxious.        Objective:   Physical Exam Constitutional:      General: She is not in acute distress.    Appearance: Normal appearance. She is well-developed. She is not ill-appearing or diaphoretic.  HENT:     Head: Normocephalic and atraumatic.     Right Ear: Tympanic membrane, ear canal and external ear normal.     Left Ear: Tympanic membrane, ear canal and external ear normal.     Nose: Nose normal. No congestion.     Mouth/Throat:     Mouth: Mucous membranes are moist.     Pharynx: Oropharynx is clear. No posterior oropharyngeal erythema.  Eyes:     General: No scleral icterus.       Right eye: No discharge.        Left eye: No discharge.     Extraocular Movements: Extraocular movements intact.     Conjunctiva/sclera: Conjunctivae normal.     Pupils: Pupils are equal, round, and reactive to light.  Neck:     Thyroid : No thyromegaly.     Vascular: No carotid bruit or JVD.  Cardiovascular:     Rate and Rhythm: Normal rate  and regular rhythm.     Pulses: Normal pulses.     Heart sounds: Normal heart sounds.     No gallop.  Pulmonary:     Effort: Pulmonary effort is normal. No respiratory distress.     Breath sounds: Normal breath sounds. No wheezing or rales.     Comments: Good air exch Chest:     Chest wall: No tenderness.  Abdominal:     General: Bowel sounds are normal. There is no distension or abdominal bruit.     Palpations: Abdomen is soft. There is no mass.     Tenderness: There is no abdominal tenderness.     Hernia: No hernia is present.  Genitourinary:    Comments: Breast and pelvic exam are done by gyn provider   Musculoskeletal:        General: No tenderness. Normal range of motion.     Cervical back: Normal range of motion and neck supple. No rigidity. No muscular tenderness.     Right lower leg: No edema.     Left lower leg: No edema.     Comments: No kyphosis   Lymphadenopathy:     Cervical: No cervical adenopathy.  Skin:    General: Skin is warm and dry.     Coloration: Skin is not pale.     Findings: No erythema or rash.     Comments: Baseline brown nevi of different sizes on trunk and limbs    Neurological:     Mental  Status: She is alert. Mental status is at baseline.     Cranial Nerves: No cranial nerve deficit.     Motor: No abnormal muscle tone.     Coordination: Coordination normal.     Gait: Gait normal.     Deep Tendon Reflexes: Reflexes are normal and symmetric. Reflexes normal.  Psychiatric:        Attention and Perception: Attention normal.        Speech: Speech normal.        Behavior: Behavior normal.        Cognition and Memory: Cognition and memory normal.     Comments: Mildly anxious Candidly discusses symptoms and stressors   Pleasant            Assessment & Plan:   Problem List Items Addressed This Visit       Endocrine   Hypothyroid   Hypothyroidism  Pt has no clinical changes No change in energy level/ hair or skin/ edema and no  tremor Lab Results  Component Value Date   TSH 5.46 12/08/2023    Plan to continue levothyroxine  25 mcg daily       Relevant Medications   levothyroxine  (SYNTHROID ) 25 MCG tablet     Other   Routine general medical examination at a health care facility - Primary   Reviewed health habits including diet and exercise and skin cancer prevention Reviewed appropriate screening tests for age  Also reviewed health mt list, fam hx and immunization status , as well as social and family history   See HPI Labs reviewed and ordered Sees gyn/ recent visit and pap is utd  Colonoscopy 09/2022 with 5 y recall for polyps and fam history  Discussed fall prevention, supplements and exercise for bone density  Utd derm care/discussed sun protection PHQ and GAD7 high- presc medication today and discussed counseling Health Maintenance  Topic Date Due   Hepatitis C Screening  Never done   HIV Screening  01/07/2024*   Pap with HPV screening  07/13/2024   DTaP/Tdap/Td vaccine (2 - Td or Tdap) 12/19/2024   Colon Cancer Screening  10/01/2027   Flu Shot  Completed   COVID-19 Vaccine  Completed   HPV Vaccine  Aged Out  *Topic was postponed. The date shown is not the original due date.          Palpitations   Improved Had cardiac monitor that was reassuring       Hyperlipidemia   Disc goals for lipids and reasons to control them Rev last labs with pt Rev low sat fat diet in detail LDL up to 139 after holiday eating Good HDL  Will work on diet  Continue to monitor  Hyperlipidemia runs in the family       Generalized anxiety disorder   Some improvement with Ashwagonda but still bothersome  GAD 7 score of 9 and PHQ of 11  Stress is up and down with work  Interested in option for medication with option to take prn  Prescribed buspar  7.5 mg bid prn (or bid)  Discussed expectations of SSRI type medication including time to effectiveness and mechanism of action, also poss of side effects  (early and late)- including mental fuzziness, weight or appetite change, nausea and poss of worse dep or anxiety (even suicidal thoughts)  Pt voiced understanding and will stop med and update if this occurs   May consider counseling again-will let us  know if she needs a referral  Encouraged self care Has some  good coping skills        Relevant Medications   busPIRone  (BUSPAR ) 15 MG tablet   Family history of colonic polyps   Had screening colonoscopy 09/2022 with 5 year recall

## 2023-12-14 NOTE — Patient Instructions (Addendum)
 Stay active  Add some strength training to your routine, this is important for bone and brain health and can reduce your risk of falls and help your body use insulin properly and regulate weight  Light weights, exercise bands , and internet videos are a good way to start  Yoga (chair or regular), machines , floor exercises or a gym with machines are also good options    Try the buspar  7.5 mg up to twice daily as needed for anxiety  If you feel worse stop it and let us  know  If any intolerable side effects- also reach out   Let us  know if you want to see a counselor  Meditation is helpful  So is writing in a journal  Also exercise   For cholesterol Avoid red meat/ fried foods/ egg yolks/ fatty breakfast meats/ butter, cheese and high fat dairy/ and shellfish   Try to get most of your carbohydrates from produce (with the exception of white potatoes) and whole grains Eat less bread/pasta/rice/snack foods/cereals/sweets and other items from the middle of the grocery store (processed carbs)

## 2023-12-14 NOTE — Assessment & Plan Note (Signed)
 Had screening colonoscopy 09/2022 with 5 year recall

## 2023-12-14 NOTE — Assessment & Plan Note (Signed)
 Some improvement with Ashwagonda but still bothersome  GAD 7 score of 9 and PHQ of 11  Stress is up and down with work  Interested in option for medication with option to take prn  Prescribed buspar  7.5 mg bid prn (or bid)  Discussed expectations of SSRI type medication including time to effectiveness and mechanism of action, also poss of side effects (early and late)- including mental fuzziness, weight or appetite change, nausea and poss of worse dep or anxiety (even suicidal thoughts)  Pt voiced understanding and will stop med and update if this occurs   May consider counseling again-will let us  know if she needs a referral  Encouraged self care Has some good coping skills

## 2023-12-14 NOTE — Assessment & Plan Note (Signed)
 Reviewed health habits including diet and exercise and skin cancer prevention Reviewed appropriate screening tests for age  Also reviewed health mt list, fam hx and immunization status , as well as social and family history   See HPI Labs reviewed and ordered Sees gyn/ recent visit and pap is utd  Colonoscopy 09/2022 with 5 y recall for polyps and fam history  Discussed fall prevention, supplements and exercise for bone density  Utd derm care/discussed sun protection PHQ and GAD7 high- presc medication today and discussed counseling Health Maintenance  Topic Date Due   Hepatitis C Screening  Never done   HIV Screening  01/07/2024*   Pap with HPV screening  07/13/2024   DTaP/Tdap/Td vaccine (2 - Td or Tdap) 12/19/2024   Colon Cancer Screening  10/01/2027   Flu Shot  Completed   COVID-19 Vaccine  Completed   HPV Vaccine  Aged Out  *Topic was postponed. The date shown is not the original due date.

## 2023-12-14 NOTE — Assessment & Plan Note (Signed)
 Disc goals for lipids and reasons to control them Rev last labs with pt Rev low sat fat diet in detail LDL up to 139 after holiday eating Good HDL  Will work on diet  Continue to monitor  Hyperlipidemia runs in the family

## 2024-02-13 ENCOUNTER — Ambulatory Visit: Admitting: Internal Medicine

## 2024-02-13 ENCOUNTER — Encounter: Payer: Self-pay | Admitting: Internal Medicine

## 2024-02-13 VITALS — BP 124/80 | HR 96 | Temp 98.6°F | Ht 68.5 in | Wt 228.0 lb

## 2024-02-13 DIAGNOSIS — J069 Acute upper respiratory infection, unspecified: Secondary | ICD-10-CM | POA: Diagnosis not present

## 2024-02-13 MED ORDER — PROMETHAZINE-DM 6.25-15 MG/5ML PO SYRP: 5.0000 mL | ORAL_SOLUTION | Freq: Four times a day (QID) | ORAL | 0 refills | Status: DC | PRN

## 2024-02-13 MED ORDER — BENZONATATE 200 MG PO CAPS: 200.0000 mg | ORAL_CAPSULE | Freq: Three times a day (TID) | ORAL | 0 refills | Status: DC | PRN

## 2024-02-13 NOTE — Progress Notes (Unsigned)
   Subjective:   Patient ID: Tracey Garcia, female    DOB: September 02, 1986, 39 y.o.   MRN: [id number]  HPI The patient is a 38 YO female coming in for 5-6 days of symptoms of cold.   Review of Systems  Constitutional:  Positive for activity change, appetite change and fatigue. Negative for chills, fever and unexpected weight change.  HENT:  Positive for congestion, postnasal drip, rhinorrhea and sinus pressure. Negative for ear discharge, ear pain, sinus pain, sneezing, sore throat, tinnitus, trouble swallowing and voice change.   Eyes: Negative.   Respiratory:  Positive for cough. Negative for chest tightness, shortness of breath and wheezing.   Cardiovascular: Negative.   Gastrointestinal: Negative.   Musculoskeletal:  Positive for myalgias.  Neurological: Negative.     Objective:  Physical Exam Constitutional:      Appearance: She is well-developed.  HENT:     Head: Normocephalic and atraumatic.     Comments: Oropharynx with redness and clear drainage, nose with swollen turbinates, TMs normal bilaterally.  Neck:     Thyroid: No thyromegaly.  Cardiovascular:     Rate and Rhythm: Normal rate and regular rhythm.  Pulmonary:     Effort: Pulmonary effort is normal. No respiratory distress.     Breath sounds: Normal breath sounds. No wheezing or rales.  Abdominal:     Palpations: Abdomen is soft.  Musculoskeletal:        General: Tenderness present.     Cervical back: Normal range of motion.  Lymphadenopathy:     Cervical: No cervical adenopathy.  Skin:    General: Skin is warm and dry.  Neurological:     Mental Status: She is alert and oriented to person, place, and time.     Vitals:   02/13/24 1439  BP: 124/80  Pulse: 96  Temp: 98.6 F (37 C)  TempSrc: Oral  SpO2: 99%  Weight: 228 lb (103.4 kg)  Height: 5' 8.5" (1.74 m)    Assessment & Plan:

## 2024-02-13 NOTE — Patient Instructions (Signed)
 We have sent in the tessalon perles to use up to 3 times a day for cough.  We have sent in promethazine/dm cough syrup to use in the evening.

## 2024-02-16 NOTE — Assessment & Plan Note (Signed)
 Rx tessalon perles for daytime and promethazine/dm for night time. Educated about typical timeframe for most viral illnesses 7-10 days and cough can last for several weeks more.

## 2024-03-28 ENCOUNTER — Ambulatory Visit: Payer: Self-pay

## 2024-03-28 NOTE — Telephone Encounter (Signed)
 Copied from CRM [id number]. Topic: Clinical - Red Word Triage >> Mar 28, 2024  9:31 AM Kita Perish H wrote: [facility] that prompted transfer to Nurse Triage: Pain in stomach, sharp pain if she presses  Chief Complaint: Abdominal pain Symptoms: Pain when applying pressure Frequency: Yesterday Pertinent Negatives: Patient denies fever Disposition: [] ED /[] Urgent Care (no appt availability in office) / [x] Appointment(In office/virtual)/ []  [facility]/ [] Home Care/ [] Refused Recommended Disposition /[] [facility]/ []  Follow-up with PCP Additional Notes: Patient called in to report LUQ pain. Patient stated pain is only present when she applies pressure, bends over or presses her stomach against something. Patient stated she first noticed the pain yesterday. Patient denied fever, vomiting and urinary symptoms. Advised patient to be seen within 24 hours, per protocol. No availability with PCP. Scheduled with alternate provider in office. Provided care advice and instructed patient to call back if symptoms worsen. Patient complied.   Reason for Disposition  [1] MODERATE pain (e.g., interferes with normal activities) AND [2] pain comes and goes (cramps) AND [3] present > 24 hours  (Exception: Pain with Vomiting or Diarrhea - see that Guideline.)  Answer Assessment - Initial Assessment Questions 1. LOCATION: "Where does it hurt?"      LUQ 2. RADIATION: "Does the pain shoot anywhere else?" (e.g., chest, back)     States pain stays localized  3. ONSET: "When did the pain begin?" (e.g., minutes, hours or days ago)      Yesterday  4. SUDDEN: "Gradual or sudden onset?"     Gradual- states pain intensity is gradually becoming worse 5. PATTERN "Does the pain come and go, or is it constant?"    - If it comes and goes: "How long does it last?" "Do you have pain now?"     (Note: Comes and goes means the pain is intermittent. It goes away completely between bouts.)    - If constant: "Is it  getting better, staying the same, or getting worse?"      (Note: Constant means the pain never goes away completely; most serious pain is constant and gets worse.)      Comes with pressure, goes away when not applying pressure 6. SEVERITY: "How bad is the pain?"  (e.g., Scale 1-10; mild, moderate, or severe)    - MILD (1-3): Doesn't interfere with normal activities, abdomen soft and not tender to touch.     - MODERATE (4-7): Interferes with normal activities or awakens from sleep, abdomen tender to touch.     - SEVERE (8-10): Excruciating pain, doubled over, unable to do any normal activities.       Rates pain an 8 when she applies pressure, states abdomin is not painful when not applying pressure  7. RECURRENT SYMPTOM: "Have you ever had this type of stomach pain before?" If Yes, ask: "When was the last time?" and "What happened that time?"      Denies 8. CAUSE: "What do you think is causing the stomach pain?"     Unknown 9. RELIEVING/AGGRAVATING FACTORS: "What makes it better or worse?" (e.g., antacids, bending or twisting motion, bowel movement)     Applying pressure or pressing stomach against something 10. OTHER SYMPTOMS: "Do you have any other symptoms?" (e.g., back pain, diarrhea, fever, urination pain, vomiting)       Denies NV, denies fever, denied urinary symptoms, lower back pain- states she has been putting a lot of strain on her back recently  11. PREGNANCY: "Is there any chance you  are pregnant?" "When was your last menstrual period?"       States she is on her period  Protocols used: Abdominal Pain - Female-A-AH

## 2024-03-28 NOTE — Telephone Encounter (Signed)
 Noted--I will check her at tomorrow's visit

## 2024-03-29 ENCOUNTER — Ambulatory Visit: Admitting: Internal Medicine

## 2024-03-29 ENCOUNTER — Encounter: Payer: Self-pay | Admitting: Internal Medicine

## 2024-03-29 VITALS — BP 124/84 | HR 84 | Temp 99.1°F | Ht 68.5 in | Wt 228.0 lb

## 2024-03-29 DIAGNOSIS — R109 Unspecified abdominal pain: Secondary | ICD-10-CM | POA: Diagnosis not present

## 2024-03-29 NOTE — Progress Notes (Signed)
 Subjective:    Patient ID: Tracey Garcia, female    DOB: 1986-02-03, 38 y.o.   MRN: [id number]  HPI Here due to abdominal pain  Having odd pain in upper abdomen towards left side--2 days ago Noticed it in car leaning forward and pants pressed against it Sharp when pressed on --no other pain  No fever No N/V Eating fine Bowels move fine--no change  She did carry laundry basket up stairs propped on her abdomen---last week No other obvious injuries  Current Outpatient Medications on File Prior to Visit  Medication Sig Dispense Refill   busPIRone  (BUSPAR ) 15 MG tablet Take 0.5 tablets (7.5 mg total) by mouth 2 (two) times daily as needed. 30 tablet 3   fluticasone (FLONASE) 50 MCG/ACT nasal spray Place 2 sprays into both nostrils daily as needed for allergies or rhinitis.     levothyroxine  (SYNTHROID ) 25 MCG tablet Take 1 tablet (25 mcg total) by mouth daily. 90 tablet 3   loratadine (CLARITIN) 10 MG tablet      No current facility-administered medications on file prior to visit.    Allergies  Allergen Reactions   Chlorine     Trouble breathing     Past Medical History:  Diagnosis Date   Allergy    Anxiety    Anxiety    History of IBS    History of seasonal allergies    Thyroid  disease     Past Surgical History:  Procedure Laterality Date   UMBILICAL HERNIA REPAIR Bilateral 01/2005    Family History  Problem Relation Age of Onset   Cancer Mother    Celiac disease Mother    Lung cancer Mother    Other Mother        AV node Tachycardia   High Cholesterol Father    Syncope episode Father        micturition syncope   Colon polyps Father    High Cholesterol Sister    Arthritis Maternal Grandmother    High blood pressure Maternal Grandmother    Sjogren's syndrome Maternal Grandmother    Heart disease Maternal Grandfather    Diabetes Maternal Grandfather    High blood pressure Paternal Grandmother    Heart attack Paternal Grandfather 29   Colon cancer  Other        great grandmother   Stomach cancer Neg Hx    Esophageal cancer Neg Hx     Social History   Socioeconomic History   Marital status: Married    Spouse name: Not on file   Number of children: 0   Years of education: Not on file   Highest education level: 12th grade  Occupational History   Occupation: Medical billing  Tobacco Use   Smoking status: Never   Smokeless tobacco: Never  Vaping Use   Vaping status: Never Used  Substance and Sexual Activity   Alcohol use: Yes    Alcohol/week: 0.0 standard drinks of alcohol    Comment: socially   Drug use: No   Sexual activity: Not on file  Other Topics Concern   Not on file  Social History Narrative   Not on file   Social Drivers of Health   Financial Resource Strain: Medium Risk (03/28/2024)   Overall Financial Resource Strain (CARDIA)    Difficulty of Paying Living Expenses: Somewhat hard  Food Insecurity: Food Insecurity Present (03/28/2024)   Hunger Vital Sign    Worried About Running Out of Food in the Last Year: Sometimes true  Ran Out of Food in the Last Year: Patient declined  Transportation Needs: No Transportation Needs (03/28/2024)   PRAPARE - Administrator, Civil Service (Medical): No    Lack of Transportation (Non-Medical): No  Physical Activity: Unknown (03/28/2024)   Exercise Vital Sign    Days of Exercise per Week: 0 days    Minutes of Exercise per Session: Not on file  Stress: Stress Concern Present (03/28/2024)   [employer] of Occupational Health - Occupational Stress Questionnaire    Feeling of Stress : Very much  Social Connections: Socially Integrated (03/28/2024)   Social Connection and Isolation Panel [NHANES]    Frequency of Communication with Friends and Family: More than three times a week    Frequency of Social Gatherings with Friends and Family: Twice a week    Attends Religious Services: More than 4 times per year    Active Member of [employer] or Organizations: Yes     Attends Engineer, structural: More than 4 times per year    Marital Status: Married  Catering manager Violence: Not on file   Review of Systems Dysuria or urgency LMP 3 days ago--has her normal cramping and back pain from that Slight cough--related to allergies    Objective:   Physical Exam Constitutional:      Appearance: She is well-developed.  Cardiovascular:     Rate and Rhythm: Normal rate and regular rhythm.     Heart sounds: No murmur heard.    No gallop.  Pulmonary:     Effort: Pulmonary effort is normal.     Breath sounds: Normal breath sounds. No wheezing or rales.  Abdominal:     General: Bowel sounds are normal. There is no distension.     Palpations: Abdomen is soft.     Tenderness: There is no abdominal tenderness. There is no guarding or rebound.     Comments: Murphy's sign absent Pain area is LUQ--but no tenderness or splenic enlargement  Musculoskeletal:     Cervical back: Neck supple.  Lymphadenopathy:     Cervical: No cervical adenopathy.  Neurological:     Mental Status: She is alert.            Assessment & Plan:

## 2024-03-29 NOTE — Assessment & Plan Note (Signed)
 Doesn't seem to be intraperitoneal at all No spleen enlargement Discussed that this should be self limited---could try heat or lidocaine patch but optional
